# Patient Record
Sex: Female | Born: 1990 | Race: White | Hispanic: No | Marital: Married | Smoking: Never smoker
Health system: Southern US, Community
[De-identification: ages and names within clinical notes are randomized; demographics above are authoritative.]

## PROBLEM LIST (undated history)

## (undated) DIAGNOSIS — J309 Allergic rhinitis, unspecified: Secondary | ICD-10-CM

## (undated) DIAGNOSIS — A6000 Herpesviral infection of urogenital system, unspecified: Secondary | ICD-10-CM

## (undated) DIAGNOSIS — Z23 Encounter for immunization: Secondary | ICD-10-CM

## (undated) DIAGNOSIS — J45909 Unspecified asthma, uncomplicated: Secondary | ICD-10-CM

## (undated) HISTORY — DX: Unspecified asthma, uncomplicated: J45.909

## (undated) HISTORY — DX: Encounter for immunization: Z23

## (undated) HISTORY — DX: Herpesviral infection of urogenital system, unspecified: A60.00

## (undated) HISTORY — PX: WISDOM TOOTH EXTRACTION: SHX21

## (undated) HISTORY — DX: Allergic rhinitis, unspecified: J30.9

---
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
## 2014-12-15 DIAGNOSIS — A6000 Herpesviral infection of urogenital system, unspecified: Secondary | ICD-10-CM | POA: Insufficient documentation

## 2014-12-15 DIAGNOSIS — B86 Scabies: Secondary | ICD-10-CM | POA: Insufficient documentation

## 2014-12-15 DIAGNOSIS — Z7251 High risk heterosexual behavior: Secondary | ICD-10-CM | POA: Insufficient documentation

## 2014-12-15 DIAGNOSIS — F430 Acute stress reaction: Secondary | ICD-10-CM | POA: Insufficient documentation

## 2014-12-15 DIAGNOSIS — F419 Anxiety disorder, unspecified: Secondary | ICD-10-CM | POA: Insufficient documentation

## 2014-12-15 DIAGNOSIS — N390 Urinary tract infection, site not specified: Secondary | ICD-10-CM | POA: Insufficient documentation

## 2014-12-19 ENCOUNTER — Encounter: Payer: Self-pay | Admitting: Family Medicine

## 2014-12-19 ENCOUNTER — Ambulatory Visit (INDEPENDENT_AMBULATORY_CARE_PROVIDER_SITE_OTHER): Payer: BLUE CROSS/BLUE SHIELD | Admitting: Family Medicine

## 2014-12-19 ENCOUNTER — Ambulatory Visit: Payer: Self-pay | Admitting: Family Medicine

## 2014-12-19 VITALS — BP 104/76 | HR 76 | Temp 98.5°F | Resp 16 | Wt 129.2 lb

## 2014-12-19 DIAGNOSIS — A09 Infectious gastroenteritis and colitis, unspecified: Secondary | ICD-10-CM | POA: Diagnosis not present

## 2014-12-19 DIAGNOSIS — R197 Diarrhea, unspecified: Secondary | ICD-10-CM

## 2014-12-19 DIAGNOSIS — A6000 Herpesviral infection of urogenital system, unspecified: Secondary | ICD-10-CM | POA: Diagnosis not present

## 2014-12-19 MED ORDER — VALACYCLOVIR HCL 500 MG PO TABS: 500.0000 mg | ORAL_TABLET | Freq: Every day | ORAL | Status: DC

## 2014-12-19 NOTE — Patient Instructions (Signed)
Chronic Diarrhea Diarrhea is frequent loose and watery bowel movements. It can cause you to feel weak and dehydrated. Dehydration can cause you to become tired and thirsty and to have a dry mouth, decreased urination, and dark yellow urine. Diarrhea is a sign of another problem, most often an infection that will not last long. In most cases, diarrhea lasts 2-3 days. Diarrhea that lasts longer than 4 weeks is called long-lasting (chronic) diarrhea. It is important to treat your diarrhea as directed by your health care provider to lessen or prevent future episodes of diarrhea.  CAUSES  There are many causes of chronic diarrhea. The following are some possible causes:   Gastrointestinal infections caused by viruses, bacteria, or parasites.   Food poisoning or food allergies.   Certain medicines, such as antibiotics, chemotherapy, and laxatives.   Artificial sweeteners and fructose.   Digestive disorders, such as celiac disease and inflammatory bowel diseases.   Irritable bowel syndrome.  Some disorders of the pancreas.  Disorders of the thyroid.  Reduced blood flow to the intestines.  Cancer. Sometimes the cause of chronic diarrhea is unknown. RISK FACTORS  Having a severely weakened immune system, such as from HIV or AIDS.   Taking certain types of cancer-fighting drugs (such as with chemotherapy) or other medicines.   Having had a recent organ transplant.   Having a portion of the stomach or small bowel removed.   Traveling to countries where food and water supplies are often contaminated.  SYMPTOMS  In addition to frequent, loose stools, diarrhea may cause:   Cramping.   Abdominal pain.   Nausea.   Fever.  Fatigue.  Urgent need to use the bathroom.  Loss of bowel control. DIAGNOSIS  Your health care provider must take a careful history and perform a physical exam. Tests given are based on your symptoms and history. Tests may include:   Blood or  stool tests. Three or more stool samples may be examined. Stool cultures may be used to test for bacteria or parasites.   X-rays.   A procedure in which a thin tube is inserted into the mouth or rectum (endoscopy). This allows the health care provider to look inside the intestine.  TREATMENT   Treatment is aimed at correcting the cause of the diarrhea when possible.  Diarrhea caused by an infection can often be treated with antibiotic medicines.  Diarrhea not caused by an infection may require you to take long-term medicine or have surgery. Specific treatment should be discussed with your health care provider.  If the cause cannot be determined, treatment aims to relieve symptoms and prevent dehydration. Serious health problems can occur if you do not maintain proper fluid levels. Treatment may include:  Taking an oral rehydration solution (ORS).  Not drinking beverages that contain caffeine (such as tea, coffee, and soft drinks).  Not drinking alcohol.  Maintaining well-balanced nutrition to help you recover faster. HOME CARE INSTRUCTIONS   Drink enough fluids to keep urine clear or pale yellow. Drink 1 cup (8 oz) of fluid for each diarrhea episode. Avoid fluids that contain simple sugars, fruit juices, whole milk products, and sodas. Hydrate with an ORS. You may purchase the ORS or prepare it at home by mixing the following ingredients together:   - tsp (1.7-3  mL) table salt.   tsp (3  mL) baking soda.   tsp (1.7 mL) salt substitute containing potassium chloride.  1 tbsp (20 mL) sugar.  4.2 c (1 L) of water.     Certain foods and beverages may increase the speed at which food moves through the gastrointestinal (GI) tract. These foods and beverages should be avoided. They include:  Caffeinated and alcoholic beverages.  High-fiber foods, such as raw fruits and vegetables, nuts, seeds, and whole grain breads and cereals.  Foods and beverages sweetened with sugar  alcohols, such as xylitol, sorbitol, and mannitol.   Some foods may be well tolerated and may help thicken stool. These include:  Starchy foods, such as rice, toast, pasta, low-sugar cereal, oatmeal, grits, baked potatoes, crackers, and bagels.  Bananas.  Applesauce.  Add probiotic-rich foods to help increase healthy bacteria in the GI tract. These include yogurt and fermented milk products.  Wash your hands well after each diarrhea episode.  Only take over-the-counter or prescription medicines as directed by your health care provider.  Take a warm bath to relieve any burning or pain from frequent diarrhea episodes. SEEK MEDICAL CARE IF:   You are not urinating as often.  Your urine is a dark color.  You become very tired or dizzy.  You have severe pain in the abdomen or rectum.  Your have blood or pus in your stools.  Your stools look black and tarry. SEEK IMMEDIATE MEDICAL CARE IF:   You are unable to keep fluids down.  You have persistent vomiting.  You have blood in your stool.  Your stools are black and tarry.  You do not urinate in 6-8 hours, or there is only a small amount of very dark urine.  You have abdominal pain that increases or localizes.  You have weakness, dizziness, confusion, or lightheadedness.  You have a severe headache.  Your diarrhea gets worse or does not get better.  You have a fever or persistent symptoms for more than 2-3 days.  You have a fever and your symptoms suddenly get worse. MAKE SURE YOU:   Understand these instructions.  Will watch your condition.  Will get help right away if you are not doing well or get worse.   This information is not intended to replace advice given to you by your health care provider. Make sure you discuss any questions you have with your health care provider.   Document Released: 04/13/2003 Document Revised: 01/26/2013 Document Reviewed: 07/16/2012 Elsevier Interactive Patient Education 2016  Elsevier Inc[employer]  

## 2014-12-19 NOTE — Progress Notes (Signed)
Patient ID: Valerie Avila, female   DOB: [birth year], 24 y.o.   MRN: [id number] Name: Valerie Avila   MRN: [id number]    DOB: 1990-10-04   Date:12/19/2014       Progress Note  Subjective  Chief Complaint  Chief Complaint  Patient presents with  . Abdominal Pain    Abdominal Pain This is a new problem. The current episode started more than 1 month ago. The problem occurs intermittently. The problem has been unchanged. Associated symptoms include diarrhea and nausea.  Initial bout of diarrhea and cramps with some vomiting occurred approximately 1 month ago when her community had E.coli in the city [place]. The second week she did not have any bowel movements and did not take any anti-diarrheal medications. Restarted diarrhea and cramps after meals over the past couple weeks. No further vomiting and no hematochezia. No weight loss or dizziness. Eating 3 meals a day. Worried about having persistent infection in gut.  Past Surgical History  Procedure Laterality Date  . Wisdom tooth extraction     Family History  Problem Relation Age of Onset  . Healthy Mother   . Hyperlipidemia Father   . Hypertension Father   . Healthy Sister   . Heart failure Maternal Grandmother   . Pancreatic cancer Maternal Grandfather   . Heart disease Paternal Grandfather    Patient Active Problem List   Diagnosis Date Noted  . Acute stress disorder 12/15/2014  . Anxiety 12/15/2014  . Genital herpes 12/15/2014  . High risk sexual behavior 12/15/2014  . Frequent UTI 12/15/2014  . 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 Asthma, exogenous 02/05/2003   Social History  Substance Use Topics  . Smoking status: Never Smoker   . Smokeless tobacco: Never Used  . Alcohol Use: 0.0 oz/week    0 Standard drinks or equivalent per week     Comment: OCCASIONALLY DRINKS BEER    Current outpatient prescriptions:  .  albuterol (PROVENTIL HFA;VENTOLIN HFA) 108 (90 BASE) MCG/ACT inhaler,  ALBUTEROL, 90MCG/ACT (Inhalation Aerosol Solution)  2 puffs every 4-6 hours as needed for 0 days  Quantity: 0.00;  Refills: 0   Ordered :17-Oct-2009  Darlin Priestly ;  Started 29-Jun-2008 Active Comments: Medication taken as needed. , Disp: , Rfl:  .  ascorbic acid (VITAMIN C) 500 MG tablet, Take by mouth., Disp: , Rfl:  .  desogestrel-ethinyl estradiol (APRI) 0.15-30 MG-MCG tablet, Take by mouth., Disp: , Rfl:  .  fexofenadine (ALLEGRA ALLERGY) 180 MG tablet, Take by mouth., Disp: , Rfl:  .  fluticasone (FLONASE) 50 MCG/ACT nasal spray, Place into the nose., Disp: , Rfl:  .  montelukast (SINGULAIR) 10 MG tablet, Take by mouth., Disp: , Rfl:  .  valACYclovir (VALTREX) 500 MG tablet, Take by mouth., Disp: , Rfl:   Allergies  Allergen Reactions  . Amoxicillin Rash   Review of Systems  Constitutional: Negative.   HENT: Negative.   Eyes: Negative.   Respiratory: Negative.   Cardiovascular: Negative.   Gastrointestinal: Positive for nausea, abdominal pain and diarrhea.  Genitourinary: Negative.   Musculoskeletal: Negative.   Skin: Negative.   Neurological: Negative.   Endo/Heme/Allergies: Negative.   Psychiatric/Behavioral: Negative.    Objective  Filed Vitals:   12/19/14 1623  BP: 104/76  Pulse: 76  Temp: 98.5 F (36.9 C)  TempSrc: Oral  Resp: 16  Weight: 129 lb 3.2 oz (58.605 kg)  SpO2: 95%   Physical Exam  Constitutional: She is oriented to person,  place, and time and well-developed, well-nourished, and in no distress.  HENT:  Head: Normocephalic and atraumatic.  Right Ear: External ear normal.  Left Ear: External ear normal.  Eyes: Conjunctivae and EOM are normal.  Neck: Normal range of motion. Neck supple.  Cardiovascular: Normal rate, regular rhythm and normal heart sounds.   Pulmonary/Chest: Effort normal and breath sounds normal.  Abdominal: Soft. Bowel sounds are normal. She exhibits no mass. There is no guarding.  Musculoskeletal: Normal range of motion.   Neurological: She is alert and oriented to person, place, and time.  Skin: No rash noted.  Psychiatric: Memory, affect and judgment normal.   Assessment & Plan 1. Diarrhea of presumed infectious origin Persistent loose to watery stools without fever over the past 2-3 weeks. Denies travel outside of the [place] or to the flood affected areas of [place]. With history of her community having E.coli contamination to their city water, will get stool culture and check for O&P. May use Pepto-Bismol prn cramps. May need probiotic and antibiotic treatment pending lab reports. Continue to drink extra fluids and limit dairy products. Denies dizziness or fainting sensations. No weight loss since May 2016 (actually 4 lbs heavier). - Stool Culture - Ova and parasite examination  2. Genital herpes No outbreak in the past year as long as she takes the Valtrex once a day to prevent recurrences. Will refill and follow up prn. - valACYclovir (VALTREX) 500 MG tablet; Take 1 tablet (500 mg total) by mouth daily.  Dispense: 30 tablet; Refill: 3

## 2015-07-11 ENCOUNTER — Ambulatory Visit (INDEPENDENT_AMBULATORY_CARE_PROVIDER_SITE_OTHER): Payer: BLUE CROSS/BLUE SHIELD | Admitting: Family Medicine

## 2015-07-11 ENCOUNTER — Encounter: Payer: Self-pay | Admitting: Family Medicine

## 2015-07-11 VITALS — BP 104/60 | HR 72 | Temp 97.8°F | Resp 16 | Wt 128.0 lb

## 2015-07-11 DIAGNOSIS — N3001 Acute cystitis with hematuria: Secondary | ICD-10-CM

## 2015-07-11 DIAGNOSIS — R109 Unspecified abdominal pain: Secondary | ICD-10-CM

## 2015-07-11 LAB — POCT URINALYSIS DIPSTICK
BILIRUBIN UA: NEGATIVE
Glucose, UA: NEGATIVE
KETONES UA: NEGATIVE
Nitrite, UA: NEGATIVE
PH UA: 6.5
Protein, UA: NEGATIVE
Spec Grav, UA: >=1.03
Urobilinogen, UA: 0.2

## 2015-07-11 MED ORDER — NITROFURANTOIN MONOHYD MACRO 100 MG PO CAPS: 100.0000 mg | ORAL_CAPSULE | Freq: Two times a day (BID) | ORAL | Status: DC

## 2015-07-11 NOTE — Progress Notes (Signed)
Patient ID: Valerie Avila, female   DOB: 1990-09-23, 23 y.o.   MRN: [id number]       Patient: Valerie Avila Female    DOB: [birth year]   25 y.o.   MRN: [id number] Visit Date: 07/11/2015  Today's Provider: Vernie Murders, PA   Chief Complaint  Patient presents with  . Urinary Tract Infection    X 4 days.    Subjective:    Urinary Tract Infection  This is a new problem. The current episode started in the past 7 days. The problem has been gradually worsening. There has been no fever. Associated symptoms include flank pain and frequency. Pertinent negatives include no urgency.  Patient reports that she has had symptoms for the last 4 days. She denies any hematuria, but she does c/o low back pain. Patient reports that she has been taking AZO with no relief.   No past medical history on file. Patient Active Problem List   Diagnosis Date Noted  . Acute stress disorder 12/15/2014  . Anxiety 12/15/2014  . Genital herpes 12/15/2014  . High risk sexual behavior 12/15/2014  . Frequent UTI 12/15/2014  . 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 Asthma, exogenous 02/05/2003   Past Surgical History  Procedure Laterality Date  . Wisdom tooth extraction     Family History  Problem Relation Age of Onset  . Healthy Mother   . Hyperlipidemia Father   . Hypertension Father   . Healthy Sister   . Heart failure Maternal Grandmother   . Pancreatic cancer Maternal Grandfather   . Heart disease Paternal Grandfather    Allergies  Allergen Reactions  . Amoxicillin Rash   Previous Medications   ALBUTEROL (PROVENTIL HFA;VENTOLIN HFA) 108 (90 BASE) MCG/ACT INHALER    ALBUTEROL, 90MCG/ACT (Inhalation Aerosol Solution)  2 puffs every 4-6 hours as needed for 0 days  Quantity: 0.00;  Refills: 0   Ordered :17-Oct-2009  Darlin Priestly ;  Started 29-Jun-2008 Active Comments: Medication taken as needed.    ASCORBIC ACID (VITAMIN C) 500 MG TABLET    Take by mouth.   DESOGESTREL-ETHINYL  ESTRADIOL (APRI) 0.15-30 MG-MCG TABLET    Take by mouth.   FEXOFENADINE (ALLEGRA ALLERGY) 180 MG TABLET    Take by mouth.   FLUTICASONE (FLONASE) 50 MCG/ACT NASAL SPRAY    Place into the nose.   MONTELUKAST (SINGULAIR) 10 MG TABLET    Take by mouth.   VALACYCLOVIR (VALTREX) 500 MG TABLET    Take 1 tablet (500 mg total) by mouth daily.    Review of Systems  Constitutional: Negative.   Genitourinary: Positive for frequency and flank pain. Negative for urgency, decreased urine volume, vaginal bleeding, vaginal discharge, difficulty urinating, vaginal pain and pelvic pain.    Social History  Substance Use Topics  . Smoking status: Never Smoker   . Smokeless tobacco: Never Used  . Alcohol Use: 0.0 oz/week    0 Standard drinks or equivalent per week     Comment: OCCASIONALLY DRINKS BEER   Objective:   BP 104/60 mmHg  Pulse 72  Temp(Src) 97.8 F (36.6 C)  Resp 16  Wt 128 lb (58.06 kg) LMP 3 weeks ago (will finish Apri 28 day pack next week).  Physical Exam  Constitutional: She is oriented to person, place, and time. She appears well-developed and well-nourished. No distress.  HENT:  Head: Normocephalic and atraumatic.  Right Ear: Hearing normal.  Left Ear: Hearing normal.  Nose: Nose normal.  Eyes: Conjunctivae and lids are normal.  Right eye exhibits no discharge. Left eye exhibits no discharge. No scleral icterus.  Neck: Neck supple.  Cardiovascular: Normal rate and regular rhythm.   Pulmonary/Chest: Effort normal and breath sounds normal. No respiratory distress.  Abdominal: Bowel sounds are normal. There is tenderness.  Tender suprapubic region over bladder. Left CVA tenderness to percussion.   Musculoskeletal: Normal range of motion.  Neurological: She is alert and oriented to person, place, and time.  Skin: Skin is intact. No lesion and no rash noted.  Psychiatric: She has a normal mood and affect. Her speech is normal and behavior is normal. Thought content normal.        Assessment & Plan:     1. Acute cystitis with hematuria Onset with dysuria and left flank pain 3 days ago. Tried AZO-Standard and slightly better. Family history positive for renal stones. Patient has had UTI's twice a year. No gross hematuria (noted on dipstick today). Urinalysis showed TNTC WBC's and 2-3+ bacterial rods. Will start antibiotic, continue AZO, increase water intake and get renal ultrasound to rule out stone. Sent specimen for C&S. Recheck pending reports. - Urine culture - POCT urinalysis dipstick - nitrofurantoin, macrocrystal-monohydrate, (MACROBID) 100 MG capsule; Take 1 capsule (100 mg total) by mouth 2 (two) times daily.  Dispense: 20 capsule; Refill: 0  2. Left flank pain Onset Saturday 07-08-15. No gross hematuria. Will get renal ultrasound to rule out stone. - US Renal       Vernie Murders, [facility]

## 2015-07-13 LAB — URINE CULTURE

## 2015-07-17 ENCOUNTER — Ambulatory Visit: Payer: Self-pay | Attending: Family Medicine

## 2015-07-18 DIAGNOSIS — L309 Dermatitis, unspecified: Secondary | ICD-10-CM | POA: Diagnosis not present

## 2015-11-16 DIAGNOSIS — D225 Melanocytic nevi of trunk: Secondary | ICD-10-CM | POA: Diagnosis not present

## 2015-12-14 ENCOUNTER — Ambulatory Visit (INDEPENDENT_AMBULATORY_CARE_PROVIDER_SITE_OTHER): Payer: BLUE CROSS/BLUE SHIELD | Admitting: Family Medicine

## 2015-12-14 ENCOUNTER — Ambulatory Visit
Admission: RE | Admit: 2015-12-14 | Discharge: 2015-12-14 | Disposition: A | Discharge status: Home / Self Care | Payer: BLUE CROSS/BLUE SHIELD | Source: Ambulatory Visit | Attending: Family Medicine | Admitting: Family Medicine

## 2015-12-14 ENCOUNTER — Encounter: Payer: Self-pay | Admitting: Family Medicine

## 2015-12-14 VITALS — BP 102/68 | HR 80 | Temp 97.8°F | Resp 16 | Wt 129.0 lb

## 2015-12-14 DIAGNOSIS — Z23 Encounter for immunization: Secondary | ICD-10-CM | POA: Diagnosis not present

## 2015-12-14 DIAGNOSIS — M79644 Pain in right finger(s): Secondary | ICD-10-CM | POA: Insufficient documentation

## 2015-12-14 DIAGNOSIS — M79641 Pain in right hand: Secondary | ICD-10-CM | POA: Diagnosis not present

## 2015-12-14 DIAGNOSIS — M7989 Other specified soft tissue disorders: Secondary | ICD-10-CM | POA: Diagnosis not present

## 2015-12-14 NOTE — Progress Notes (Signed)
Patient: Valerie Avila Female    DOB: Nov 14, 1990   25 y.o.   MRN: [id number] Visit Date: 12/14/2015  Today's Provider: Vernie Murders, PA   Chief Complaint  Patient presents with  . Hand Pain   Subjective:    Hand Pain   The incident occurred 3 to 5 days ago. Incident location: playing ball. Pain location: right middle finger. Associated symptoms comments: Pain, swelling, bruising . The symptoms are aggravated by movement. She has tried nothing for the symptoms.    Patient Active Problem List   Diagnosis Date Noted  . Acute stress disorder 12/15/2014  . Anxiety 12/15/2014  . Genital herpes 12/15/2014  . High risk sexual behavior 12/15/2014  . Frequent UTI 12/15/2014  . 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 Asthma, exogenous 02/05/2003   Past Surgical History:  Procedure Laterality Date  . WISDOM TOOTH EXTRACTION     Family History  Problem Relation Age of Onset  . Healthy Mother   . Hyperlipidemia Father   . Hypertension Father   . Healthy Sister   . Heart failure Maternal Grandmother   . Pancreatic cancer Maternal Grandfather   . Heart disease Paternal Grandfather    Allergies  Allergen Reactions  . Amoxicillin Rash  . Prednisone Rash    Rash on face, swelling, sick      Previous Medications   ALBUTEROL (PROVENTIL HFA;VENTOLIN HFA) 108 (90 BASE) MCG/ACT INHALER    ALBUTEROL, 90MCG/ACT (Inhalation Aerosol Solution)  2 puffs every 4-6 hours as needed for 0 days  Quantity: 0.00;  Refills: 0   Ordered :17-Oct-2009  Darlin Priestly ;  Started 29-Jun-2008 Active Comments: Medication taken as needed.    ASCORBIC ACID (VITAMIN C) 500 MG TABLET    Take by mouth.   DESOGESTREL-ETHINYL ESTRADIOL (APRI) 0.15-30 MG-MCG TABLET    Take by mouth.   FEXOFENADINE (ALLEGRA ALLERGY) 180 MG TABLET    Take by mouth.   FLUTICASONE (FLONASE) 50 MCG/ACT NASAL SPRAY    Place into the nose.   MONTELUKAST (SINGULAIR) 10 MG TABLET    Take by mouth.   VALACYCLOVIR (VALTREX)  500 MG TABLET    Take 1 tablet (500 mg total) by mouth daily.    Review of Systems  Constitutional: Negative.   Respiratory: Negative.   Cardiovascular: Negative.   Musculoskeletal: Positive for arthralgias and joint swelling.    Social History  Substance Use Topics  . Smoking status: Never Smoker  . Smokeless tobacco: Never Used  . Alcohol use 0.0 oz/week     Comment: OCCASIONALLY DRINKS BEER   Objective:   BP 102/68 (BP Location: Right Arm, Patient Position: Sitting, Cuff Size: Normal)   Pulse 80   Temp 97.8 F (36.6 C) (Oral)   Resp 16   Wt 129 lb (58.5 kg)   Physical Exam  Constitutional: She is oriented to person, place, and time. She appears well-developed and well-nourished. No distress.  HENT:  Head: Normocephalic and atraumatic.  Right Ear: Hearing normal.  Left Ear: Hearing normal.  Nose: Nose normal.  Eyes: Conjunctivae and lids are normal. Right eye exhibits no discharge. Left eye exhibits no discharge. No scleral icterus.  Pulmonary/Chest: Effort normal. No respiratory distress.  Musculoskeletal: She exhibits tenderness.  Some swelling and slight ecchymosis of the right middle finger from MCP to PIP joint. Unable to flex or apply lateral pressure without increase in pain. No neurologic deficit.  Neurological: She is alert and oriented to person, place, and time.  Skin: Skin is  intact. No lesion and no rash noted.  Psychiatric: She has a normal mood and affect. Her speech is normal and behavior is normal. Thought content normal.      Assessment & Plan:     1. Pain of right middle finger Onset over the past 3-4 days. Can't remember a specific injury but started after bowling. Suspect severe sprain. Will get x-ray to rule out fracture. "Buddy-tape" to an adjacent finger for support. Take Ibuprofen 200 mg 3-4 tablets TID with food for discomfort. Recheck pending x-ray report. - DG Hand Complete Right  2. Need for influenza vaccination - Flu Vaccine QUAD 36+  mos PF IM (Fluarix & Fluzone Quad PF)

## 2015-12-15 ENCOUNTER — Telehealth: Payer: Self-pay

## 2015-12-15 NOTE — Telephone Encounter (Signed)
-----   Message from Margo Common, [place] sent at 12/14/2015  2:28 PM EST ----- Normal x-ray of hand and fingers. No bony abnormalities. Proceed with use of taping finger to the adjacent finger for support. Use Ibuprofen 200 mg 3-4 tablets TID with meals and recheck in 10-14 days if no better.

## 2015-12-15 NOTE — Telephone Encounter (Signed)
Patient has been advised. KW 

## 2016-01-05 ENCOUNTER — Encounter: Payer: Self-pay | Admitting: Family Medicine

## 2016-01-05 ENCOUNTER — Ambulatory Visit (INDEPENDENT_AMBULATORY_CARE_PROVIDER_SITE_OTHER): Payer: BLUE CROSS/BLUE SHIELD | Admitting: Family Medicine

## 2016-01-05 VITALS — BP 100/60 | HR 89 | Temp 98.3°F | Resp 16 | Wt 131.8 lb

## 2016-01-05 DIAGNOSIS — J0140 Acute pansinusitis, unspecified: Secondary | ICD-10-CM | POA: Diagnosis not present

## 2016-01-05 DIAGNOSIS — J4521 Mild intermittent asthma with (acute) exacerbation: Secondary | ICD-10-CM | POA: Diagnosis not present

## 2016-01-05 MED ORDER — ALBUTEROL SULFATE HFA 108 (90 BASE) MCG/ACT IN AERS: INHALATION_SPRAY | RESPIRATORY_TRACT | 1 refills | Status: DC

## 2016-01-05 MED ORDER — AZITHROMYCIN 250 MG PO TABS: ORAL_TABLET | ORAL | 0 refills | Status: DC

## 2016-01-05 NOTE — Progress Notes (Signed)
Patient: Valerie Avila Female    DOB: Jun 11, 1990   25 y.o.   MRN: [id number] Visit Date: 01/05/2016  Today's Provider: Vernie Murders, PA   Chief Complaint  Patient presents with  . Sinusitis   Subjective:    Sinusitis  This is a new problem. Episode onset: 9 days ago. The problem is unchanged. Maximum temperature: Some sweats and subjective fever. Associated symptoms include congestion, coughing, ear pain, headaches, shortness of breath and sinus pressure. Past treatments include oral decongestants (inhaler use). The treatment provided mild relief.   Patient Active Problem List   Diagnosis Date Noted  . Acute stress disorder 12/15/2014  . Anxiety 12/15/2014  . Genital herpes 12/15/2014  . High risk sexual behavior 12/15/2014  . Frequent UTI 12/15/2014  . 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 Asthma, exogenous 02/05/2003   Past Surgical History:  Procedure Laterality Date  . WISDOM TOOTH EXTRACTION     Family History  Problem Relation Age of Onset  . Healthy Mother   . Hyperlipidemia Father   . Hypertension Father   . Healthy Sister   . Heart failure Maternal Grandmother   . Pancreatic cancer Maternal Grandfather   . Heart disease Paternal Grandfather    Allergies  Allergen Reactions  . Amoxicillin Rash  . Prednisone Rash    Rash on face, swelling, sick      Previous Medications   ALBUTEROL (PROVENTIL HFA;VENTOLIN HFA) 108 (90 BASE) MCG/ACT INHALER    ALBUTEROL, 90MCG/ACT (Inhalation Aerosol Solution)  2 puffs every 4-6 hours as needed for 0 days  Quantity: 0.00;  Refills: 0   Ordered :17-Oct-2009  Darlin Priestly ;  Started 29-Jun-2008 Active Comments: Medication taken as needed.    ASCORBIC ACID (VITAMIN C) 500 MG TABLET    Take by mouth.   DESOGESTREL-ETHINYL ESTRADIOL (APRI) 0.15-30 MG-MCG TABLET    Take by mouth.   FEXOFENADINE (ALLEGRA ALLERGY) 180 MG TABLET    Take by mouth.   FLUTICASONE (FLONASE) 50 MCG/ACT NASAL SPRAY    Place into the nose.    MONTELUKAST (SINGULAIR) 10 MG TABLET    Take by mouth.   VALACYCLOVIR (VALTREX) 500 MG TABLET    Take 1 tablet (500 mg total) by mouth daily.    Review of Systems  Constitutional: Negative.   HENT: Positive for congestion, ear pain and sinus pressure.   Respiratory: Positive for cough and shortness of breath.   Cardiovascular: Negative.   Neurological: Positive for headaches.    Social History  Substance Use Topics  . Smoking status: Never Smoker  . Smokeless tobacco: Never Used  . Alcohol use 0.0 oz/week     Comment: OCCASIONALLY DRINKS BEER   Objective:   BP 100/60 (BP Location: Right Arm, Patient Position: Sitting, Cuff Size: Normal)   Pulse 89   Temp 98.3 F (36.8 C) (Oral)   Resp 16   Wt 131 lb 12.8 oz (59.8 kg)   LMP 12/03/2015 (Approximate)   SpO2 97%   Physical Exam  Constitutional: She is oriented to person, place, and time. She appears well-developed and well-nourished. No distress.  HENT:  Head: Normocephalic and atraumatic.  Right Ear: Hearing and external ear normal.  Left Ear: Hearing and external ear normal.  Nose: Nose normal.  Slightly reddened tonsillar pillars without exudates. Tender maxillary and frontal sinuses to palpate. Fair transillumination throughout. No active bleeding from nasal membranes.  Eyes: Conjunctivae and lids are normal. Right eye exhibits no discharge. Left eye exhibits no discharge. No  scleral icterus.  Neck: Neck supple.  Cardiovascular: Normal rate and regular rhythm.   Pulmonary/Chest: Effort normal and breath sounds normal. No respiratory distress.  Abdominal: Soft. Bowel sounds are normal.  Musculoskeletal: Normal range of motion.  Lymphadenopathy:    She has no cervical adenopathy.  Neurological: She is alert and oriented to person, place, and time.  Skin: Skin is intact. No lesion and no rash noted.  Psychiatric: She has a normal mood and affect. Her speech is normal and behavior is normal. Thought content normal.       Assessment & Plan:     1. Subacute pansinusitis Onset with some sweats, cough, PND, sinus headache and congestion over the past 9 days. No documented fever. Had a nose bleed yesterday. Will continue Mucinex-DM and Tylenol prn. May need to hold Flonase if nose bleeds recur. Treat with Z-pak and follow up as needed. - azithromycin (ZITHROMAX) 250 MG tablet; Take 2 tablets the first day by mouth then one daily for 4 days.  Dispense: 6 tablet; Refill: 0  2. Mild intermittent extrinsic asthma with acute exacerbation Mild wheezing and some cough with yellowish sputum production. Increase fluid intake, given Z-pak and continue Mucinex-DM with Albuterol Inhaler prn. Refill inhaler and recheck prn. - albuterol (PROVENTIL HFA;VENTOLIN HFA) 108 (90 Base) MCG/ACT inhaler; Two inhalations up to four times a day for wheezing/asthma as needed.  Dispense: 18 g; Refill: 1

## 2016-02-01 DIAGNOSIS — Z309 Encounter for contraceptive management, unspecified: Secondary | ICD-10-CM | POA: Diagnosis not present

## 2016-02-01 DIAGNOSIS — Z01419 Encounter for gynecological examination (general) (routine) without abnormal findings: Secondary | ICD-10-CM | POA: Diagnosis not present

## 2016-02-01 DIAGNOSIS — Z113 Encounter for screening for infections with a predominantly sexual mode of transmission: Secondary | ICD-10-CM | POA: Diagnosis not present

## 2016-02-01 DIAGNOSIS — Z124 Encounter for screening for malignant neoplasm of cervix: Secondary | ICD-10-CM | POA: Diagnosis not present

## 2016-02-01 DIAGNOSIS — Z3041 Encounter for surveillance of contraceptive pills: Secondary | ICD-10-CM | POA: Diagnosis not present

## 2016-03-19 ENCOUNTER — Other Ambulatory Visit: Payer: Self-pay | Admitting: Family Medicine

## 2016-03-19 MED ORDER — DESOGESTREL-ETHINYL ESTRADIOL 0.15-30 MG-MCG PO TABS: 1.0000 | ORAL_TABLET | Freq: Every day | ORAL | 3 refills | Status: DC

## 2016-03-19 NOTE — Telephone Encounter (Signed)
Pt needs refill on her birth control pills.  desogestrel-ethinyl estradiol (APRI) 0.15-30 MG-MCG tablet  She now uses [facility]  Pt's call back is  [phone]

## 2016-09-19 ENCOUNTER — Telehealth: Payer: Self-pay

## 2016-09-19 NOTE — Telephone Encounter (Signed)
Pt needs to ask pharmacy for previous generic birth control pills again and see if sx resolve once changing back. If they don't, we can check labs. Could be hormonal.

## 2016-09-19 NOTE — Telephone Encounter (Signed)
Patient is taking different generic of prescribed birth control due to what was available from pharmacy. Pt c/o 8-10 lb weight gain over the past 2 months. She denies changes in diet and has increased physical activity and says she is going the gym more. She doesn't feel the weight gain is normal for her and concerned. Please advise. Thank you!

## 2016-09-19 NOTE — Telephone Encounter (Signed)
Pt called after hours triage c/o that she went to pharmacy to get her birth control and the past couple of moths she has gained a lot of weight and wants to know if it can be changed.   Left msg for pt to call back to discuss birth control and weight gain. Last seen 01/2016.

## 2016-09-19 NOTE — Telephone Encounter (Signed)
Pt aware and plans to discuss with pharmacy.

## 2016-11-06 DIAGNOSIS — D235 Other benign neoplasm of skin of trunk: Secondary | ICD-10-CM | POA: Diagnosis not present

## 2016-11-06 DIAGNOSIS — D485 Neoplasm of uncertain behavior of skin: Secondary | ICD-10-CM | POA: Diagnosis not present

## 2016-11-06 DIAGNOSIS — D225 Melanocytic nevi of trunk: Secondary | ICD-10-CM | POA: Diagnosis not present

## 2016-12-24 DIAGNOSIS — H5213 Myopia, bilateral: Secondary | ICD-10-CM | POA: Diagnosis not present

## 2017-01-29 ENCOUNTER — Ambulatory Visit (INDEPENDENT_AMBULATORY_CARE_PROVIDER_SITE_OTHER): Payer: BLUE CROSS/BLUE SHIELD | Admitting: Family Medicine

## 2017-01-29 ENCOUNTER — Encounter: Payer: Self-pay | Admitting: Family Medicine

## 2017-01-29 VITALS — BP 102/64 | Temp 98.4°F | Resp 16 | Ht 64.0 in | Wt 139.0 lb

## 2017-01-29 DIAGNOSIS — J4521 Mild intermittent asthma with (acute) exacerbation: Secondary | ICD-10-CM

## 2017-01-29 MED ORDER — FLUTICASONE-SALMETEROL 250-50 MCG/DOSE IN AEPB: 1.0000 | INHALATION_SPRAY | Freq: Two times a day (BID) | RESPIRATORY_TRACT | 0 refills | Status: DC

## 2017-01-29 MED ORDER — ALBUTEROL SULFATE HFA 108 (90 BASE) MCG/ACT IN AERS: INHALATION_SPRAY | RESPIRATORY_TRACT | 1 refills | Status: DC

## 2017-01-29 MED ORDER — AZITHROMYCIN 250 MG PO TABS: ORAL_TABLET | ORAL | 0 refills | Status: DC

## 2017-01-29 NOTE — Progress Notes (Signed)
Patient: Valerie Avila Female    DOB: 1990/12/03   26 y.o.   MRN: [id number] Visit Date: 01/29/2017  Today's Provider: Lelon Huh, MD   Chief Complaint  Patient presents with  . URI   Subjective:    URI   This is a new problem. The current episode started in the past 7 days (3 days). There has been no fever (has felt feverish). Associated symptoms include congestion, coughing, headaches, rhinorrhea and sinus pain. Pertinent negatives include no abdominal pain, chest pain, nausea or vomiting. She has tried decongestant and acetaminophen for the symptoms. The treatment provided no relief.   Has had asthma since childhood, now having to use inhaler every day, which is effective. Is taking Singulair every day, but no maintenance inhalers. . She does not smoke. She has mild sore throat. Scheduled for CPE next week at [facility] and anticipates getting flu vaccine then.   Allergies  Allergen Reactions  . Amoxicillin Rash  . Prednisone Rash    Rash on face, swelling, sick      Current Outpatient Medications:  .  albuterol (PROVENTIL HFA;VENTOLIN HFA) 108 (90 Base) MCG/ACT inhaler, Two inhalations up to four times a day for wheezing/asthma as needed., Disp: 18 g, Rfl: 1 .  desogestrel-ethinyl estradiol (APRI) 0.15-30 MG-MCG tablet, Take 1 tablet by mouth daily., Disp: 1 Package, Rfl: 3 .  fexofenadine (ALLEGRA ALLERGY) 180 MG tablet, Take by mouth., Disp: , Rfl:  .  fluticasone (FLONASE) 50 MCG/ACT nasal spray, Place into the nose., Disp: , Rfl:  .  montelukast (SINGULAIR) 10 MG tablet, Take by mouth., Disp: , Rfl:  .  valACYclovir (VALTREX) 500 MG tablet, Take 1 tablet (500 mg total) by mouth daily., Disp: 30 tablet, Rfl: 3 .  ascorbic acid (VITAMIN C) 500 MG tablet, Take by mouth., Disp: , Rfl:  .  azithromycin (ZITHROMAX) 250 MG tablet, Take 2 tablets the first day by mouth then one daily for 4 days. (Patient not taking: Reported on 01/29/2017), Disp: 6 tablet, Rfl: 0  Review of  Systems  Constitutional: Negative for appetite change, chills, fatigue and fever.  HENT: Positive for congestion, postnasal drip, rhinorrhea and sinus pain.   Respiratory: Positive for cough. Negative for chest tightness and shortness of breath.   Cardiovascular: Negative for chest pain and palpitations.  Gastrointestinal: Negative for abdominal pain, nausea and vomiting.  Neurological: Positive for headaches. Negative for dizziness and weakness.    Social History   Tobacco Use  . Smoking status: Never Smoker  . Smokeless tobacco: Never Used  Substance Use Topics  . Alcohol use: Yes    Alcohol/week: 0.0 oz    Comment: OCCASIONALLY DRINKS BEER   Objective:   BP 102/64 (BP Location: Left Arm, Patient Position: Sitting, Cuff Size: Normal)   Temp 98.4 F (36.9 C)   Resp 16   Ht 5\' 4"  (1.626 m)   Wt 139 lb (63 kg)   BMI 23.86 kg/m  Vitals:   01/29/17 1644  BP: 102/64  Resp: 16  Temp: 98.4 F (36.9 C)  Weight: 139 lb (63 kg)  Height: 5\' 4"  (1.626 m)     Physical Exam  General Appearance:    Alert, cooperative, no distress  HENT:   bilateral TM normal without fluid or infection, neck without nodes, pharynx erythematous without exudate, sinuses nontender and nasal mucosa congested  Eyes:    PERRL, conjunctiva/corneas clear, EOM's intact       Lungs:  Occasional faint wheeze, respirations unlabored  Heart:    Regular rate and rhythm  Neurologic:   Awake, alert, oriented x 3. No apparent focal neurological           defect.           Assessment & Plan:     1. Mild intermittent extrinsic asthma with acute exacerbation She has had adverse reactions to prednisone in the past, but has taken Advair without adverse reaction.  - azithromycin (ZITHROMAX) 250 MG tablet; 2 by mouth today, then 1 daily for 4 days  Dispense: 6 tablet; Refill: 0 - Fluticasone-Salmeterol (ADVAIR DISKUS) 250-50 MCG/DOSE AEPB; Inhale 1 puff into the lungs 2 times daily at 12 noon and 4 pm for 7  days.  Dispense: 14 each; Refill: 0 - albuterol (PROVENTIL HFA;VENTOLIN HFA) 108 (90 Base) MCG/ACT inhaler; Two inhalations up to four times a day for wheezing/asthma as needed.  Dispense: 18 g; Refill: 1  Call if symptoms change or if not rapidly improving.          Lelon Huh, MD  [facility]

## 2017-02-03 ENCOUNTER — Ambulatory Visit (INDEPENDENT_AMBULATORY_CARE_PROVIDER_SITE_OTHER): Payer: BLUE CROSS/BLUE SHIELD | Admitting: Obstetrics and Gynecology

## 2017-02-03 ENCOUNTER — Encounter: Payer: Self-pay | Admitting: Obstetrics and Gynecology

## 2017-02-03 VITALS — BP 110/74 | HR 84 | Ht 64.0 in | Wt 137.0 lb

## 2017-02-03 DIAGNOSIS — Z01419 Encounter for gynecological examination (general) (routine) without abnormal findings: Secondary | ICD-10-CM | POA: Diagnosis not present

## 2017-02-03 DIAGNOSIS — Z113 Encounter for screening for infections with a predominantly sexual mode of transmission: Secondary | ICD-10-CM

## 2017-02-03 DIAGNOSIS — Z124 Encounter for screening for malignant neoplasm of cervix: Secondary | ICD-10-CM

## 2017-02-03 DIAGNOSIS — Z3041 Encounter for surveillance of contraceptive pills: Secondary | ICD-10-CM | POA: Diagnosis not present

## 2017-02-03 MED ORDER — DESOGESTREL-ETHINYL ESTRADIOL 0.15-30 MG-MCG PO TABS: 1.0000 | ORAL_TABLET | Freq: Every day | ORAL | 3 refills | Status: DC

## 2017-02-03 NOTE — Patient Instructions (Signed)
I value your feedback and entrusting us with your care. If you get a [facility] patient survey, I would appreciate you taking the time to let us know about your experience today. Thank you! 

## 2017-02-03 NOTE — Progress Notes (Signed)
PCP:  Margo Common, PA   Chief Complaint  Patient presents with  . Gynecologic Exam    wants to make sure everything is alright ie able to concieve     HPI:      [name] is a 26 y.o. No obstetric history on file. who LMP was Patient's last menstrual period was 01/19/2017 (exact date)., presents today for her annual examination.  Her menses are regular every 28-30 days, lasting 4 days.  Dysmenorrhea mild, occurring first 1-2 days of flow. She does not usually have intermenstrual bleeding but had 1 day of it this month, without late/missed OCPs.  Sex activity: single partner, contraception - OCP (estrogen/progesterone).  Last Pap: February 01, 2016  Results were: no abnormalities Hx of STDs: HSV, takes valtrex prn, done through PCP  There is no FH of breast cancer. There is no FH of ovarian cancer. The patient does do self-breast exams.  Tobacco use: The patient denies current or previous tobacco use. Alcohol use: none No drug use.  Exercise: moderately active  She does get adequate calcium and Vitamin D in her diet.  Gardasil completed.    Past Medical History:  Diagnosis Date  . Allergic rhinitis   . Asthma   . Genital herpes   . Vaccine for human papilloma virus (HPV) types 6, 11, 16, and 18 administered     Past Surgical History:  Procedure Laterality Date  . WISDOM TOOTH EXTRACTION      Family History  Problem Relation Age of Onset  . Healthy Mother   . Hyperlipidemia Father   . Hypertension Father   . Healthy Sister   . Heart failure Maternal Grandmother   . Pancreatic cancer Maternal Grandfather   . Heart disease Paternal Grandfather     Social History   Socioeconomic History  . Marital status: Single    Spouse name: Not on file  . Number of children: Not on file  . Years of education: Not on file  . Highest education level: Not on file  Social Needs  . Financial resource strain: Not on file  . Food insecurity - worry: Not on  file  . Food insecurity - inability: Not on file  . Transportation needs - medical: Not on file  . Transportation needs - non-medical: Not on file  Occupational History  . Not on file  Tobacco Use  . Smoking status: Never Smoker  . Smokeless tobacco: Never Used  Substance and Sexual Activity  . Alcohol use: Yes    Alcohol/week: 0.0 oz    Comment: OCCASIONALLY DRINKS BEER  . Drug use: No  . Sexual activity: Yes    Birth control/protection: Pill  Other Topics Concern  . Not on file  Social History Narrative  . Not on file    Current Meds  Medication Sig  . albuterol (PROVENTIL HFA;VENTOLIN HFA) 108 (90 Base) MCG/ACT inhaler Two inhalations up to four times a day for wheezing/asthma as needed.  Marland Kitchen ascorbic acid (VITAMIN C) 500 MG tablet Take by mouth.  . desogestrel-ethinyl estradiol (APRI) 0.15-30 MG-MCG tablet Take 1 tablet by mouth daily.  . fexofenadine (ALLEGRA ALLERGY) 180 MG tablet Take by mouth.  . fluticasone (FLONASE) 50 MCG/ACT nasal spray Place into the nose.  Marland Kitchen Fluticasone-Salmeterol (ADVAIR DISKUS) 250-50 MCG/DOSE AEPB Inhale 1 puff into the lungs 2 times daily at 12 noon and 4 pm for 7 days.  . montelukast (SINGULAIR) 10 MG tablet Take by mouth.  . valACYclovir (VALTREX)  500 MG tablet Take 1 tablet (500 mg total) by mouth daily.  . [DISCONTINUED] desogestrel-ethinyl estradiol (APRI) 0.15-30 MG-MCG tablet Take 1 tablet by mouth daily.     ROS:  Review of Systems  Constitutional: Positive for fatigue. Negative for fever and unexpected weight change.  Respiratory: Positive for cough and wheezing. Negative for shortness of breath.   Cardiovascular: Negative for chest pain, palpitations and leg swelling.  Gastrointestinal: Negative for blood in stool, constipation, diarrhea, nausea and vomiting.  Endocrine: Negative for cold intolerance, heat intolerance and polyuria.  Genitourinary: Negative for dyspareunia, dysuria, flank pain, frequency, genital sores, hematuria,  menstrual problem, pelvic pain, urgency, vaginal bleeding, vaginal discharge and vaginal pain.  Musculoskeletal: Negative for back pain, joint swelling and myalgias.  Skin: Negative for rash.  Neurological: Negative for dizziness, syncope, light-headedness, numbness and headaches.  Hematological: Negative for adenopathy.  Psychiatric/Behavioral: Negative for agitation, confusion, sleep disturbance and suicidal ideas. The patient is not nervous/anxious.      Objective: BP 110/74   Pulse 84   Ht 5\' 4"  (1.626 m)   Wt 137 lb (62.1 kg)   LMP 01/19/2017 (Exact Date)   BMI 23.52 kg/m    Physical Exam  Constitutional: She is oriented to person, place, and time. She appears well-developed and well-nourished.  Genitourinary: Vagina normal and uterus normal. There is no rash or tenderness on the right labia. There is no rash or tenderness on the left labia. No erythema or tenderness in the vagina. No vaginal discharge found. Right adnexum does not display mass and does not display tenderness. Left adnexum does not display mass and does not display tenderness. Cervix does not exhibit motion tenderness or polyp. Uterus is not enlarged or tender.  Neck: Normal range of motion. No thyromegaly present.  Cardiovascular: Normal rate, regular rhythm and normal heart sounds.  No murmur heard. Pulmonary/Chest: Effort normal and breath sounds normal. Right breast exhibits no mass, no nipple discharge, no skin change and no tenderness. Left breast exhibits no mass, no nipple discharge, no skin change and no tenderness.  Abdominal: Soft. There is no tenderness. There is no guarding.  Musculoskeletal: Normal range of motion.  Neurological: She is alert and oriented to person, place, and time. No cranial nerve deficit.  Psychiatric: She has a normal mood and affect. Her behavior is normal.  Vitals reviewed.   Assessment/Plan: Encounter for annual routine gynecological examination  Cervical cancer screening  - Plan: IGP,CtNgTv,rfx Aptima HPV ASCU  Screening for STD (sexually transmitted disease) - Plan: IGP,CtNgTv,rfx Aptima HPV ASCU  Encounter for surveillance of contraceptive pills - OCP RF. - Plan: desogestrel-ethinyl estradiol (APRI) 0.15-30 MG-MCG tablet  Meds ordered this encounter  Medications  . desogestrel-ethinyl estradiol (APRI) 0.15-30 MG-MCG tablet    Sig: Take 1 tablet by mouth daily.    Dispense:  3 Package    Refill:  3             GYN counsel adequate intake of calcium and vitamin D, diet and exercise     F/U  Return in about 1 year (around 02/03/2018).  Jerin Franzel B. Emonnie Cannady, PA-C 02/03/2017 10:27 AM

## 2017-02-06 LAB — IGP,CTNGTV,RFX APTIMA HPV ASCU
Chlamydia, Nuc. Acid Amp: NEGATIVE
Gonococcus, Nuc. Acid Amp: NEGATIVE
PAP SMEAR COMMENT: 0
Trich vag by NAA: NEGATIVE

## 2017-03-27 ENCOUNTER — Telehealth: Payer: Self-pay | Admitting: Family Medicine

## 2017-03-27 NOTE — Telephone Encounter (Signed)
Pt is scheduled on 04/18/17 4 pm for Td injection. Please advise if this is incorrect or needs to be rescheduled. Thanks TNP

## 2017-03-27 NOTE — Telephone Encounter (Signed)
Td was due on 07/09/2016. Okay to keep appointment.

## 2017-04-18 ENCOUNTER — Ambulatory Visit (INDEPENDENT_AMBULATORY_CARE_PROVIDER_SITE_OTHER): Payer: Managed Care, Other (non HMO) | Admitting: Family Medicine

## 2017-04-18 DIAGNOSIS — Z23 Encounter for immunization: Secondary | ICD-10-CM

## 2017-05-09 ENCOUNTER — Encounter: Payer: Self-pay | Admitting: Family Medicine

## 2017-05-09 ENCOUNTER — Ambulatory Visit: Payer: Managed Care, Other (non HMO) | Admitting: Family Medicine

## 2017-05-09 VITALS — BP 120/80 | HR 100 | Temp 98.5°F | Resp 16 | Wt 138.0 lb

## 2017-05-09 DIAGNOSIS — J4521 Mild intermittent asthma with (acute) exacerbation: Secondary | ICD-10-CM

## 2017-05-09 DIAGNOSIS — J329 Chronic sinusitis, unspecified: Secondary | ICD-10-CM

## 2017-05-09 DIAGNOSIS — J309 Allergic rhinitis, unspecified: Secondary | ICD-10-CM | POA: Insufficient documentation

## 2017-05-09 DIAGNOSIS — J301 Allergic rhinitis due to pollen: Secondary | ICD-10-CM

## 2017-05-09 MED ORDER — AZITHROMYCIN 250 MG PO TABS
ORAL_TABLET | ORAL | 0 refills | Status: AC
Start: 2017-05-09 — End: 2017-05-14

## 2017-05-09 MED ORDER — FLUTICASONE PROPIONATE (INHAL) 250 MCG/BLIST IN AEPB: 1.0000 | INHALATION_SPRAY | Freq: Two times a day (BID) | RESPIRATORY_TRACT | 12 refills | Status: AC

## 2017-05-09 NOTE — Progress Notes (Signed)
Patient: Valerie Avila Female    DOB: 1990/02/28   27 y.o.   MRN: [id number] Visit Date: 05/09/2017  Today's Provider: Lelon Huh, MD   Chief Complaint  Patient presents with  . Sinusitis   Subjective:    Patient has had sinus pain and pressure for 6 days. Other symptoms includes: cough, sore throat, headache, left ear pain, wheezing and shortness of breath. Patient has been taking Singulair, dqyquil and nightquil with mild relief.   Sinusitis  This is a new problem. The current episode started in the past 7 days. There has been no fever. The pain is moderate. Associated symptoms include congestion, coughing, ear pain, headaches, shortness of breath, sinus pressure, sneezing, a sore throat and swollen glands. Pertinent negatives include no chills, diaphoresis, hoarse voice or neck pain. Treatments tried: dayquil and nightquil. The treatment provided mild relief.   She states she has been using albuterol inhaler 2-3 times a day which is effective for a few hours. Last dose was 5-6 hours ago. Was previously taking Advair, but has been out for a few months. Is taking Singulair, Allegra, and fluticasone nasal spray everyday, but hasn't been helping this week.      Allergies  Allergen Reactions  . Amoxicillin Rash  . Prednisone Rash    Rash on face, swelling, sick      Current Outpatient Medications:  .  albuterol (PROVENTIL HFA;VENTOLIN HFA) 108 (90 Base) MCG/ACT inhaler, Two inhalations up to four times a day for wheezing/asthma as needed., Disp: 18 g, Rfl: 1 .  ascorbic acid (VITAMIN C) 500 MG tablet, Take by mouth., Disp: , Rfl:  .  desogestrel-ethinyl estradiol (APRI) 0.15-30 MG-MCG tablet, Take 1 tablet by mouth daily., Disp: 3 Package, Rfl: 3 .  fexofenadine (ALLEGRA ALLERGY) 180 MG tablet, Take by mouth., Disp: , Rfl:  .  fluticasone (FLONASE) 50 MCG/ACT nasal spray, Place into the nose., Disp: , Rfl:  .  montelukast (SINGULAIR) 10 MG tablet, Take by mouth., Disp: ,  Rfl:  .  valACYclovir (VALTREX) 500 MG tablet, Take 1 tablet (500 mg total) by mouth daily., Disp: 30 tablet, Rfl: 3 .  Fluticasone-Salmeterol (ADVAIR DISKUS) 250-50 MCG/DOSE AEPB, Inhale 1 puff into the lungs 2 times daily at 12 noon and 4 pm for 7 days., Disp: 14 each, Rfl: 0  Review of Systems  Constitutional: Negative for appetite change, chills, diaphoresis, fatigue and fever.  HENT: Positive for congestion, ear pain, sinus pressure, sinus pain, sneezing and sore throat. Negative for hoarse voice.   Respiratory: Positive for cough, shortness of breath and wheezing. Negative for chest tightness.   Cardiovascular: Negative for chest pain and palpitations.  Gastrointestinal: Negative for abdominal pain, nausea and vomiting.  Musculoskeletal: Negative for neck pain.  Neurological: Positive for headaches. Negative for dizziness and weakness.    Social History   Tobacco Use  . Smoking status: Never Smoker  . Smokeless tobacco: Never Used  Substance Use Topics  . Alcohol use: Yes    Alcohol/week: 0.0 oz    Comment: OCCASIONALLY DRINKS BEER   Objective:   BP 120/80 (BP Location: Right Arm, Patient Position: Sitting, Cuff Size: Normal)   Pulse 100   Temp 98.5 F (36.9 C) (Oral)   Resp 16   Wt 138 lb (62.6 kg)   SpO2 98%   BMI 23.69 kg/m  Vitals:   05/09/17 1554  BP: 120/80  Pulse: 100  Resp: 16  Temp: 98.5 F (36.9 C)  TempSrc:  Oral  SpO2: 98%  Weight: 138 lb (62.6 kg)     Physical Exam  General Appearance:    Alert, cooperative, no distress  HENT:   right TM normal without fluid or infection, left TM fluid noted, neck without nodes, pharynx erythematous without exudate, frontal sinus tender and nasal mucosa pale and congested  Eyes:    PERRL, conjunctiva/corneas clear, EOM's intact       Lungs:     Clear to auscultation bilaterally, respirations unlabored  Heart:    Regular rate and rhythm  Neurologic:   Awake, alert, oriented x 3. No apparent focal neurological            defect.          Assessment & Plan:     1. Mild intermittent extrinsic asthma with acute exacerbation Not currently using maintenance inhaler but requiring rescue inhaler multiple times daily. Lungs clear at this time. Advised that she needs to stay on maintenance inhaler throughout allergy season. Previously on Advair, will start - Fluticasone Propionate, Inhal, (FLOVENT DISKUS) 250 MCG/BLIST AEPB; Inhale 1 puff into the lungs 2 (two) times daily.  Dispense: 60 each; Refill: 12  2. Sinusitis, unspecified chronicity, unspecified location  - azithromycin (ZITHROMAX) 250 MG tablet; 2 by mouth today, then 1 daily for 4 days  Dispense: 6 tablet; Refill: 0  Call if symptoms change or if not rapidly improving.     3. Allergic rhinitis due to pollen, unspecified seasonality Continue montelukast and fluticasone nasal spray.        Lelon Huh, MD  [facility]

## 2017-06-24 ENCOUNTER — Encounter: Payer: Self-pay | Admitting: Physician Assistant

## 2017-06-24 ENCOUNTER — Ambulatory Visit: Payer: Managed Care, Other (non HMO) | Admitting: Physician Assistant

## 2017-06-24 VITALS — BP 110/70 | HR 74 | Temp 98.3°F | Resp 16 | Wt 138.0 lb

## 2017-06-24 DIAGNOSIS — H01001 Unspecified blepharitis right upper eyelid: Secondary | ICD-10-CM

## 2017-06-24 MED ORDER — TRIAMCINOLONE ACETONIDE 0.1 % EX CREA: 1.0000 "application " | TOPICAL_CREAM | Freq: Two times a day (BID) | CUTANEOUS | 0 refills | Status: DC

## 2017-06-24 NOTE — Progress Notes (Signed)
Patient: Valerie Avila Female    DO: 1990/04/12   27 y.o.   MRM: [id number] Visit Date: 06/24/2017  Today's Provider: Mar Daring, PA-C   Chief Complaint  Patient presents with  . Eye Problem   Subjective:    HPI Patient here today C/O sunburn on fore head, and right eye lid swelling since yesterday and worse this morning. Patient denise any discharge or injuries. No discharge. No eye pain. No visual changes.    Allergies  Allergen Reactions  . Amoxicillin Rash  . Prednisone Rash    Rash on face, swelling, sick      Current Outpatient Medications:  .  albuterol (PROVENTIL HFA;VENTOLIN HFA) 108 (90 Base) MCG/ACT inhaler, Two inhalations up to four times a day for wheezing/asthma as needed., Disp: 18 g, Rfl: 1 .  ascorbic acid (VITAMIN C) 500 MG tablet, Take by mouth., Disp: , Rfl:  .  desogestrel-ethinyl estradiol (APRI) 0.15-30 MG-MCG tablet, Take 1 tablet by mouth daily., Disp: 3 Package, Rfl: 3 .  fexofenadine (ALLEGRA ALLERGY) 180 MG tablet, Take by mouth., Disp: , Rfl:  .  fluticasone (FLONASE) 50 MCG/ACT nasal spray, Place into the nose., Disp: , Rfl:  .  Fluticasone Propionate, Inhal, (FLOVENT DISKUS) 250 MCG/BLIST AEPB, Inhale 1 puff into the lungs 2 (two) times daily., Disp: 60 each, Rfl: 12 .  montelukast (SINGULAIR) 10 MG tablet, Take by mouth., Disp: , Rfl:  .  valACYclovir (VALTREX) 500 MG tablet, Take 1 tablet (500 mg total) by mouth daily., Disp: 30 tablet, Rfl: 3  Review of Systems  Constitutional: Negative.   HENT: Negative.   Eyes: Positive for pain (eye lid, not eyeball). Negative for photophobia, discharge, redness, itching and visual disturbance.  Respiratory: Negative.   Cardiovascular: Negative.   Skin: Positive for rash.  Neurological: Negative.     Social History   Tobacco Use  . Smoking status: Never Smoker  . Smokeless tobacco: Never Used  Substance Use Topics  . Alcohol use: Yes    Alcohol/week: 0.0 oz    Comment:  OCCASIONALLY DRINKS BEER   Objective:   There were no vitals taken for this visit. There were no vitals filed for this visit.   Physical Exam  Constitutional: She appears well-developed and well-nourished. No distress.  Eyes: Pupils are equal, round, and reactive to light. Conjunctivae and EOM are normal. Right eye exhibits no chemosis, no discharge, no exudate and no hordeolum. No foreign body present in the right eye. Left eye exhibits no chemosis, no discharge, no exudate and no hordeolum. No foreign body present in the left eye. No scleral icterus.  Right upper eyelid swollen and tender  Neck: Normal range of motion. Neck supple.  Cardiovascular: Normal rate, regular rhythm and normal heart sounds. Exam reveals no gallop and no friction rub.  No murmur heard. Pulmonary/Chest: Effort normal and breath sounds normal. No respiratory distress. She has no wheezes. She has no rales.  Lymphadenopathy:    She has no cervical adenopathy.  Skin: She is not diaphoretic.  Vitals reviewed.       Assessment & Plan:     1. Blepharitis of right upper eyelid, unspecified type Will try triamcinolone cream as below for inflammation. Advised to not get into eye for risk of blindness. Do not use for more than 5 days. Warm compresses. No eye make up until resolved. Call if eye lid becomes red, painful or develop discharge. Currently more inflammatory looking than infectious, which  is why I did not give antibiotic ointment initially. She is to call if symptoms worsen.  - triamcinolone cream (KENALOG) 0.1 %; Apply 1 application topically 2 (two) times daily. Do not use for more than 5 days  Dispense: 30 g; Refill: 0       Mar Daring, PA-C  [facility]

## 2017-06-24 NOTE — Patient Instructions (Signed)
Blepharitis Blepharitis means swollen eyelids. Follow these instructions at home: Pay attention to any changes in how you look or feel. Follow these instructions to help with your condition: Keeping Clean  Wash your hands often.  Wash your eyelids with warm water, or wash them with warm water that is mixed with little bit of baby shampoo. Do this 2 or more times per day.  Wash your face and eyebrows at least once a day.  Use a clean towel each time you dry your eyelids. Do not use the towel to clean or dry other areas of your body. Do not share your towel with anyone. General instructions  Avoid wearing makeup until you get better. Do not share makeup with anyone.  Avoid rubbing your eyes.  Put a warm compress on your eyes 2 times per day for 10 minutes at a time or as told by your doctor.  If you were told to use an medicated cream or eye drops, use the medicine as told by your doctor. Do not stop using the medicine even if you feel better.  Keep all follow-up visits as told by your doctor. This is important. Contact a doctor if:  Your eyelids feel hot.  You have blisters on your eyelids.  You have a rash on your eyelids.  The swelling does not go away in 2-4 days.  The swelling gets worse. Get help right away if:  You have pain that gets worse.  You have pain that spreads to other parts of your face.  You have redness that gets worse.  You have redness that spreads to other parts of your face.  Your vision changes.  You have pain when you look at lights or things that move.  You have a fever. This information is not intended to replace advice given to you by your health care provider. Make sure you discuss any questions you have with your health care provider. Document Released: 10/31/2007 Document Revised: 06/29/2015 Document Reviewed: 05/16/2014 Elsevier Interactive Patient Education  2018 Elsevier Inc.  

## 2017-09-26 ENCOUNTER — Ambulatory Visit: Payer: Managed Care, Other (non HMO) | Admitting: Physician Assistant

## 2017-09-26 ENCOUNTER — Encounter: Payer: Self-pay | Admitting: Physician Assistant

## 2017-09-26 VITALS — BP 110/70 | HR 95 | Temp 97.4°F | Resp 16 | Wt 135.8 lb

## 2017-09-26 DIAGNOSIS — J0100 Acute maxillary sinusitis, unspecified: Secondary | ICD-10-CM | POA: Diagnosis not present

## 2017-09-26 MED ORDER — AZITHROMYCIN 250 MG PO TABS: ORAL_TABLET | ORAL | 0 refills | Status: DC

## 2017-09-26 NOTE — Progress Notes (Signed)
Patient: Valerie Avila Female    DOB: Sep 07, 1990   27 y.o.   MRN: [id number] Visit Date: 09/26/2017  Today's Provider: Mar Daring, PA-C   Chief Complaint  Patient presents with  . URI   Subjective:    URI   This is a new problem. The current episode started in the past 7 days (Tuesday night). The problem has been gradually worsening. There has been no fever (She reports she thinks she was running a fever last night). Associated symptoms include congestion, coughing, ear pain, headaches, rhinorrhea, sinus pain, sneezing, a sore throat and wheezing. She has tried decongestant, acetaminophen and NSAIDs (Mucinex) for the symptoms. The treatment provided no relief.      Allergies  Allergen Reactions  . Amoxicillin Rash  . Prednisone Rash    Rash on face, swelling, sick      Current Outpatient Medications:  .  albuterol (PROVENTIL HFA;VENTOLIN HFA) 108 (90 Base) MCG/ACT inhaler, Two inhalations up to four times a day for wheezing/asthma as needed., Disp: 18 g, Rfl: 1 .  ascorbic acid (VITAMIN C) 500 MG tablet, Take by mouth., Disp: , Rfl:  .  desogestrel-ethinyl estradiol (APRI) 0.15-30 MG-MCG tablet, Take 1 tablet by mouth daily., Disp: 3 Package, Rfl: 3 .  fexofenadine (ALLEGRA ALLERGY) 180 MG tablet, Take by mouth., Disp: , Rfl:  .  fluticasone (FLONASE) 50 MCG/ACT nasal spray, Place into the nose., Disp: , Rfl:  .  Fluticasone Propionate, Inhal, (FLOVENT DISKUS) 250 MCG/BLIST AEPB, Inhale 1 puff into the lungs 2 (two) times daily., Disp: 60 each, Rfl: 12 .  montelukast (SINGULAIR) 10 MG tablet, Take by mouth., Disp: , Rfl:  .  valACYclovir (VALTREX) 500 MG tablet, Take 1 tablet (500 mg total) by mouth daily., Disp: 30 tablet, Rfl: 3 .  triamcinolone cream (KENALOG) 0.1 %, Apply 1 application topically 2 (two) times daily. Do not use for more than 5 days (Patient not taking: Reported on 09/26/2017), Disp: 30 g, Rfl: 0  Review of Systems  Constitutional: Positive  for fever (possible).  HENT: Positive for congestion, ear pain, postnasal drip, rhinorrhea, sinus pressure, sinus pain, sneezing, sore throat, trouble swallowing and voice change.   Respiratory: Positive for cough, chest tightness, shortness of breath and wheezing.   Neurological: Positive for headaches. Negative for dizziness.    Social History   Tobacco Use  . Smoking status: Never Smoker  . Smokeless tobacco: Never Used  Substance Use Topics  . Alcohol use: Yes    Alcohol/week: 0.0 standard drinks    Comment: OCCASIONALLY DRINKS BEER   Objective:   BP 110/70 (BP Location: Left Arm, Patient Position: Sitting, Cuff Size: Normal)   Pulse 95   Temp (!) 97.4 F (36.3 C) (Oral)   Resp 16   Wt 135 lb 12.8 oz (61.6 kg)   LMP 09/25/2017   SpO2 97%   BMI 23.31 kg/m  Vitals:   09/26/17 1319  BP: 110/70  Pulse: 95  Resp: 16  Temp: (!) 97.4 F (36.3 C)  TempSrc: Oral  SpO2: 97%  Weight: 135 lb 12.8 oz (61.6 kg)     Physical Exam  Constitutional: She appears well-developed and well-nourished. No distress.  HENT:  Head: Normocephalic and atraumatic.  Right Ear: Hearing, tympanic membrane, external ear and ear canal normal.  Left Ear: Hearing, tympanic membrane, external ear and ear canal normal.  Nose: Mucosal edema and rhinorrhea present. Right sinus exhibits maxillary sinus tenderness. Right sinus exhibits no  frontal sinus tenderness. Left sinus exhibits maxillary sinus tenderness. Left sinus exhibits no frontal sinus tenderness.  Mouth/Throat: Uvula is midline and mucous membranes are normal. Posterior oropharyngeal erythema present. No oropharyngeal exudate or posterior oropharyngeal edema.  Neck: Normal range of motion. Neck supple. No tracheal deviation present. No thyromegaly present.  Cardiovascular: Normal rate, regular rhythm and normal heart sounds. Exam reveals no gallop and no friction rub.  No murmur heard. Pulmonary/Chest: Effort normal and breath sounds normal.  No stridor. No respiratory distress. She has no wheezes. She has no rales.  Lymphadenopathy:    She has no cervical adenopathy.  Skin: She is not diaphoretic.  Vitals reviewed.      Assessment & Plan:     1. Acute maxillary sinusitis, recurrence not specified Worsening symptoms that have not responded to OTC medications. Will give Zpak as below. Continue allergy medications. Stay well hydrated and get plenty of rest. Call if no symptom improvement or if symptoms worsen. - azithromycin (ZITHROMAX) 250 MG tablet; Take 2 tablets PO on day one, and one tablet PO daily thereafter until completed.  Dispense: 6 tablet; Refill: 0       Mar Daring, PA-C  [facility]

## 2017-09-26 NOTE — Patient Instructions (Signed)

## 2017-10-01 ENCOUNTER — Telehealth: Payer: Self-pay

## 2017-10-01 DIAGNOSIS — J0100 Acute maxillary sinusitis, unspecified: Secondary | ICD-10-CM

## 2017-10-01 MED ORDER — AZITHROMYCIN 250 MG PO TABS: ORAL_TABLET | ORAL | 0 refills | Status: DC

## 2017-10-01 MED ORDER — AZITHROMYCIN 250 MG PO TABS
ORAL_TABLET | ORAL | 0 refills | Status: DC
Start: 2017-10-01 — End: 2018-02-19

## 2017-10-01 NOTE — Telephone Encounter (Signed)
Sent to [facility] [facility]

## 2017-10-01 NOTE — Telephone Encounter (Signed)
Please Review

## 2017-10-01 NOTE — Telephone Encounter (Signed)
Pt called stating she was in the office 09/26/2017 for a sinus infection.  She was prescribed a Z-pak.  She finished the RX but is still not feeling better.  She would either like another round of Zpak or a stronger antibiotic sent to [place].   Contact number:  [phone]  Thanks,   -Mickel Baas

## 2017-10-01 NOTE — Telephone Encounter (Signed)
Pt advised.   Thanks,   -Ruperto Kiernan  

## 2018-01-13 ENCOUNTER — Other Ambulatory Visit: Payer: Self-pay | Admitting: Obstetrics and Gynecology

## 2018-01-13 DIAGNOSIS — Z3041 Encounter for surveillance of contraceptive pills: Secondary | ICD-10-CM

## 2018-01-27 ENCOUNTER — Telehealth: Payer: Self-pay | Admitting: Obstetrics and Gynecology

## 2018-01-27 ENCOUNTER — Other Ambulatory Visit: Payer: Self-pay

## 2018-01-27 DIAGNOSIS — Z3041 Encounter for surveillance of contraceptive pills: Secondary | ICD-10-CM

## 2018-01-27 MED ORDER — DESOGESTREL-ETHINYL ESTRADIOL 0.15-30 MG-MCG PO TABS: 1.0000 | ORAL_TABLET | Freq: Every day | ORAL | 0 refills | Status: DC

## 2018-01-27 NOTE — Telephone Encounter (Signed)
Patient is calling needing refill on birthcontrol. patient is schedule for annual . 02/19/17 ABC

## 2018-01-27 NOTE — Telephone Encounter (Signed)
RF sent to [facility]. Pt aware.

## 2018-02-19 ENCOUNTER — Ambulatory Visit (INDEPENDENT_AMBULATORY_CARE_PROVIDER_SITE_OTHER): Payer: Managed Care, Other (non HMO) | Admitting: Obstetrics and Gynecology

## 2018-02-19 ENCOUNTER — Encounter: Payer: Self-pay | Admitting: Obstetrics and Gynecology

## 2018-02-19 ENCOUNTER — Other Ambulatory Visit (HOSPITAL_COMMUNITY)
Admission: RE | Admit: 2018-02-19 | Discharge: 2018-02-19 | Disposition: A | Discharge status: Home / Self Care | Payer: Managed Care, Other (non HMO) | Source: Ambulatory Visit | Attending: Obstetrics and Gynecology | Admitting: Obstetrics and Gynecology

## 2018-02-19 VITALS — BP 110/76 | HR 81 | Ht 64.0 in | Wt 137.0 lb

## 2018-02-19 DIAGNOSIS — Z01419 Encounter for gynecological examination (general) (routine) without abnormal findings: Secondary | ICD-10-CM | POA: Diagnosis not present

## 2018-02-19 DIAGNOSIS — Z113 Encounter for screening for infections with a predominantly sexual mode of transmission: Secondary | ICD-10-CM | POA: Insufficient documentation

## 2018-02-19 DIAGNOSIS — Z23 Encounter for immunization: Secondary | ICD-10-CM

## 2018-02-19 DIAGNOSIS — Z3041 Encounter for surveillance of contraceptive pills: Secondary | ICD-10-CM | POA: Diagnosis not present

## 2018-02-19 DIAGNOSIS — Z124 Encounter for screening for malignant neoplasm of cervix: Secondary | ICD-10-CM | POA: Diagnosis not present

## 2018-02-19 DIAGNOSIS — R21 Rash and other nonspecific skin eruption: Secondary | ICD-10-CM

## 2018-02-19 MED ORDER — DESOGESTREL-ETHINYL ESTRADIOL 0.15-30 MG-MCG PO TABS: 1.0000 | ORAL_TABLET | Freq: Every day | ORAL | 3 refills | Status: DC

## 2018-02-19 NOTE — Patient Instructions (Signed)
I value your feedback and entrusting us with your care. If you get a [facility] patient survey, I would appreciate you taking the time to let us know about your experience today. Thank you! 

## 2018-02-19 NOTE — Progress Notes (Signed)
PCP:  Margo Common, PA   Chief Complaint  Patient presents with  . Gynecologic Exam    wants flu shot, has rash on right arm since monday, itches a lot     HPI:      [name] is a 27 y.o. No obstetric history on file. who LMP was Patient's last menstrual period was 02/19/2018 (exact date)., presents today for her annual examination.  Her menses are regular every 28-30 days, lasting 4 days.  Dysmenorrhea mild, occurring first 1-2 days of flow. She does not have intermenstrual bleeding.  Sex activity: single partner, contraception - OCP (estrogen/progesterone).  Last Pap: 02/03/17 Results were: no abnormalities Hx of STDs: HSV, takes valtrex prn, done through PCP  There is no FH of breast cancer. There is no FH of ovarian cancer. The patient does do self-breast exams.  Tobacco use: The patient denies current or previous tobacco use. Alcohol use: none No drug use.  Exercise: moderately active  She does not get adequate calcium and Vitamin D in her diet.  Gardasil completed.  Pt with rash on RT arm for 4 days. Started as small erythematous, itchy patch on lower aspect and has developed new patches going up her arm. 1 lesion is linear. Pt denies any outside activity, has dog who is mostly indoors, no new soaps/detergents. Has treated with hydrocortisone crm and benadryl crm and oral tabs without relief.   Past Medical History:  Diagnosis Date  . Allergic rhinitis   . Asthma   . Genital herpes   . Vaccine for human papilloma virus (HPV) types 6, 11, 16, and 18 administered     Past Surgical History:  Procedure Laterality Date  . WISDOM TOOTH EXTRACTION      Family History  Problem Relation Age of Onset  . Healthy Mother   . Hyperlipidemia Father   . Hypertension Father   . Healthy Sister   . Heart failure Maternal Grandmother   . Pancreatic cancer Maternal Grandfather   . Heart disease Paternal Grandfather     Social History   Socioeconomic  History  . Marital status: Single    Spouse name: Not on file  . Number of children: Not on file  . Years of education: Not on file  . Highest education level: Not on file  Occupational History  . Not on file  Social Needs  . Financial resource strain: Not on file  . Food insecurity:    Worry: Not on file    Inability: Not on file  . Transportation needs:    Medical: Not on file    Non-medical: Not on file  Tobacco Use  . Smoking status: Never Smoker  . Smokeless tobacco: Never Used  Substance and Sexual Activity  . Alcohol use: Yes    Alcohol/week: 0.0 standard drinks    Comment: OCCASIONALLY DRINKS BEER  . Drug use: No  . Sexual activity: Yes    Birth control/protection: Pill  Lifestyle  . Physical activity:    Days per week: Not on file    Minutes per session: Not on file  . Stress: Not on file  Relationships  . Social connections:    Talks on phone: Not on file    Gets together: Not on file    Attends religious service: Not on file    Active member of club or organization: Not on file    Attends meetings of clubs or organizations: Not on file    Relationship status:  Not on file  . Intimate partner violence:    Fear of current or ex partner: Not on file    Emotionally abused: Not on file    Physically abused: Not on file    Forced sexual activity: Not on file  Other Topics Concern  . Not on file  Social History Narrative  . Not on file    Current Meds  Medication Sig  . albuterol (PROVENTIL HFA;VENTOLIN HFA) 108 (90 Base) MCG/ACT inhaler Two inhalations up to four times a day for wheezing/asthma as needed.  Marland Kitchen ascorbic acid (VITAMIN C) 500 MG tablet Take by mouth.  . desogestrel-ethinyl estradiol (APRI) 0.15-30 MG-MCG tablet Take 1 tablet by mouth daily.  . fexofenadine (ALLEGRA ALLERGY) 180 MG tablet Take by mouth.  . fluticasone (FLONASE) 50 MCG/ACT nasal spray Place into the nose.  Marland Kitchen Fluticasone Propionate, Inhal, (FLOVENT DISKUS) 250 MCG/BLIST AEPB  Inhale 1 puff into the lungs 2 (two) times daily.  . montelukast (SINGULAIR) 10 MG tablet Take by mouth.  . valACYclovir (VALTREX) 500 MG tablet Take 1 tablet (500 mg total) by mouth daily.  . [DISCONTINUED] desogestrel-ethinyl estradiol (APRI) 0.15-30 MG-MCG tablet Take 1 tablet by mouth daily.     ROS:  Review of Systems  Constitutional: Negative for fatigue, fever and unexpected weight change.  Respiratory: Positive for wheezing. Negative for cough and shortness of breath.   Cardiovascular: Negative for chest pain, palpitations and leg swelling.  Gastrointestinal: Negative for blood in stool, constipation, diarrhea, nausea and vomiting.  Endocrine: Negative for cold intolerance, heat intolerance and polyuria.  Genitourinary: Negative for dyspareunia, dysuria, flank pain, frequency, genital sores, hematuria, menstrual problem, pelvic pain, urgency, vaginal bleeding, vaginal discharge and vaginal pain.  Musculoskeletal: Negative for back pain, joint swelling and myalgias.  Skin: Positive for rash.  Neurological: Negative for dizziness, syncope, light-headedness, numbness and headaches.  Hematological: Negative for adenopathy.  Psychiatric/Behavioral: Positive for agitation. Negative for confusion, sleep disturbance and suicidal ideas. The patient is not nervous/anxious.      Objective: BP 110/76   Pulse 81   Ht 5\' 4"  (1.626 m)   Wt 137 lb (62.1 kg)   LMP 02/19/2018 (Exact Date)   BMI 23.52 kg/m    Physical Exam Constitutional:      Appearance: She is well-developed.  Genitourinary:     Vulva, vagina, uterus, right adnexa and left adnexa normal.     No vulval lesion or tenderness noted.     No vaginal discharge, erythema or tenderness.     No cervical motion tenderness or polyp.     Uterus is not enlarged or tender.     No right or left adnexal mass present.     Right adnexa not tender.     Left adnexa not tender.  Neck:     Musculoskeletal: Normal range of motion.      Thyroid: No thyromegaly.  Cardiovascular:     Rate and Rhythm: Normal rate and regular rhythm.     Heart sounds: Normal heart sounds. No murmur.  Pulmonary:     Effort: Pulmonary effort is normal.     Breath sounds: Normal breath sounds.  Chest:     Breasts:        Right: No mass, nipple discharge, skin change or tenderness.        Left: No mass, nipple discharge, skin change or tenderness.  Abdominal:     Palpations: Abdomen is soft.     Tenderness: There is no abdominal tenderness. There is  no guarding.  Musculoskeletal: Normal range of motion.  Neurological:     Mental Status: She is alert and oriented to person, place, and time.     Cranial Nerves: No cranial nerve deficit.  Skin:         Comments: ERYTHEMATUOUS PATCHES WITH FINE PAPULES; NO PUSTULES; NO SCALE, NO D/C  Psychiatric:        Behavior: Behavior normal.  Vitals signs reviewed.     Assessment/Plan: Encounter for annual routine gynecological examination  Cervical cancer screening - Plan: Cytology - PAP  Screening for STD (sexually transmitted disease) - Plan: Cytology - PAP  Encounter for surveillance of contraceptive pills - OCP RF - Plan: desogestrel-ethinyl estradiol (APRI) 0.15-30 MG-MCG tablet  Rash - Question chem vs allergic. Cont hydrocortisone crm/benadryl/cold compresses. Looks like poison ivy but pt denies any exposure. F/u prn  Needs flu shot - Plan: Flu Vaccine QUAD 36+ mos IM (Fluarix, Quad PF)  Meds ordered this encounter  Medications  . desogestrel-ethinyl estradiol (APRI) 0.15-30 MG-MCG tablet    Sig: Take 1 tablet by mouth daily.    Dispense:  3 Package    Refill:  3    Order Specific Question:   Supervising Provider    Answer:   Gae Dry [[id number]]             GYN counsel adequate intake of calcium and vitamin D, diet and exercise     F/U  Return in about 1 year (around 02/20/2019).  Raeli Wiens B. Janathan Bribiesca, PA-C 02/19/2018 2:51 PM

## 2018-02-23 LAB — CYTOLOGY - PAP
Chlamydia: NEGATIVE
Diagnosis: NEGATIVE
NEISSERIA GONORRHEA: NEGATIVE

## 2018-02-27 ENCOUNTER — Encounter: Payer: Self-pay | Admitting: Family Medicine

## 2018-02-27 ENCOUNTER — Ambulatory Visit (INDEPENDENT_AMBULATORY_CARE_PROVIDER_SITE_OTHER): Payer: Managed Care, Other (non HMO) | Admitting: Family Medicine

## 2018-02-27 VITALS — BP 108/60 | HR 92 | Temp 98.2°F | Resp 16 | Wt 138.6 lb

## 2018-02-27 DIAGNOSIS — L309 Dermatitis, unspecified: Secondary | ICD-10-CM

## 2018-02-27 MED ORDER — TRIAMCINOLONE ACETONIDE 0.1 % EX CREA: 1.0000 "application " | TOPICAL_CREAM | Freq: Two times a day (BID) | CUTANEOUS | 0 refills | Status: DC

## 2018-02-27 NOTE — Progress Notes (Signed)
Patient: Valerie Avila Female    DOB: 07/08/1990   28 y.o.   MRN: [id number] Visit Date: 02/27/2018  Today's Provider: Vernie Murders, PA   Chief Complaint  Patient presents with  . Rash   Subjective:     Rash  This is a new problem. The current episode started 1 to 4 weeks ago (Last Tuesday started on her right arm). The problem is unchanged. The affected locations include the left arm and right arm. The rash is characterized by itchiness and redness. It is unknown ("Impetigo exposure") if there was an exposure to a precipitant. Associated symptoms include fatigue. Pertinent negatives include no fever or joint pain. Past treatments include topical steroids (Benadryl). The treatment provided mild relief.   Past Medical History:  Diagnosis Date  . Allergic rhinitis   . Asthma   . Genital herpes   . Vaccine for human papilloma virus (HPV) types 6, 11, 16, and 18 administered    Past Surgical History:  Procedure Laterality Date  . WISDOM TOOTH EXTRACTION     Family History  Problem Relation Age of Onset  . Healthy Mother   . Hyperlipidemia Father   . Hypertension Father   . Healthy Sister   . Heart failure Maternal Grandmother   . Pancreatic cancer Maternal Grandfather   . Heart disease Paternal Grandfather    Allergies  Allergen Reactions  . Amoxicillin Rash  . Prednisone Rash    Rash on face, swelling, sick     Current Outpatient Medications:  .  albuterol (PROVENTIL HFA;VENTOLIN HFA) 108 (90 Base) MCG/ACT inhaler, Two inhalations up to four times a day for wheezing/asthma as needed., Disp: 18 g, Rfl: 1 .  ascorbic acid (VITAMIN C) 500 MG tablet, Take by mouth., Disp: , Rfl:  .  desogestrel-ethinyl estradiol (APRI) 0.15-30 MG-MCG tablet, Take 1 tablet by mouth daily., Disp: 3 Package, Rfl: 3 .  fexofenadine (ALLEGRA ALLERGY) 180 MG tablet, Take by mouth., Disp: , Rfl:  .  fluticasone (FLONASE) 50 MCG/ACT nasal spray, Place into the nose., Disp: , Rfl:  .   Fluticasone Propionate, Inhal, (FLOVENT DISKUS) 250 MCG/BLIST AEPB, Inhale 1 puff into the lungs 2 (two) times daily., Disp: 60 each, Rfl: 12 .  montelukast (SINGULAIR) 10 MG tablet, Take by mouth., Disp: , Rfl:  .  valACYclovir (VALTREX) 500 MG tablet, Take 1 tablet (500 mg total) by mouth daily., Disp: 30 tablet, Rfl: 3  Review of Systems  Constitutional: Positive for fatigue. Negative for fever.  Musculoskeletal: Negative for joint pain.  Skin: Positive for rash.   Social History   Tobacco Use  . Smoking status: Never Smoker  . Smokeless tobacco: Never Used  Substance Use Topics  . Alcohol use: Yes    Alcohol/week: 0.0 standard drinks    Comment: OCCASIONALLY DRINKS BEER     Objective:   BP 108/60 (BP Location: Right Arm, Patient Position: Sitting, Cuff Size: Normal)   Pulse 92   Temp 98.2 F (36.8 C) (Oral)   Resp 16   Wt 138 lb 9.6 oz (62.9 kg)   LMP 02/19/2018 (Exact Date)   SpO2 98%   BMI 23.79 kg/m  Vitals:   02/27/18 1546  BP: 108/60  Pulse: 92  Resp: 16  Temp: 98.2 F (36.8 C)  TempSrc: Oral  SpO2: 98%  Weight: 138 lb 9.6 oz (62.9 kg)   Physical Exam Constitutional:      General: She is not in acute distress.  Appearance: She is well-developed.  HENT:     Head: Normocephalic and atraumatic.     Right Ear: Hearing normal.     Left Ear: Hearing normal.     Nose: Nose normal.  Eyes:     General: Lids are normal. No scleral icterus.       Right eye: No discharge.        Left eye: No discharge.     Conjunctiva/sclera: Conjunctivae normal.  Pulmonary:     Effort: Pulmonary effort is normal. No respiratory distress.  Musculoskeletal: Normal range of motion.  Skin:    Findings: Rash present.     Comments: Dry pink to red patches of pruritic rash on forearms - scattered lesion.  Neurological:     Mental Status: She is alert and oriented to person, place, and time.  Psychiatric:        Speech: Speech normal.        Behavior: Behavior normal.         Thought Content: Thought content normal.       Assessment & Plan    1. Eczema, unspecified type Developed a pruritic dry erythematous rash on the forearms (R>L) approximately 10 days ago. No new, lotions, foods, medications, detergents, soaps, perfumes, etc. Boyfriend works in [employer] but unknown exposure to chemicals, insulation, adhesives, etc. Some improvement with use of Hydrocortisone and Benadryl capsules. Recommend Triamcinolone 0.1% mixed equal parts with Eucerin and applied 2-3 times a day. Recheck if no better in a week. - triamcinolone cream (KENALOG) 0.1 %; Apply 1 application topically 2 (two) times daily.  Dispense: 30 g; Refill: [place], PA  [facility]

## 2018-04-14 ENCOUNTER — Ambulatory Visit (INDEPENDENT_AMBULATORY_CARE_PROVIDER_SITE_OTHER): Payer: Managed Care, Other (non HMO) | Admitting: Physician Assistant

## 2018-04-14 ENCOUNTER — Encounter: Payer: Self-pay | Admitting: Physician Assistant

## 2018-04-14 VITALS — BP 109/70 | HR 109 | Temp 98.3°F | Wt 138.4 lb

## 2018-04-14 DIAGNOSIS — A084 Viral intestinal infection, unspecified: Secondary | ICD-10-CM

## 2018-04-14 MED ORDER — PROMETHAZINE HCL 12.5 MG PO TABS: 12.5000 mg | ORAL_TABLET | Freq: Three times a day (TID) | ORAL | 0 refills | Status: DC | PRN

## 2018-04-14 NOTE — Progress Notes (Signed)
Patient: Valerie Avila Female    DOB: 1990/05/24   27 y.o.   MRN: [id number] Visit Date: 04/14/2018  Today's Provider: Trinna Post, PA-C   Chief Complaint  Patient presents with  . Diarrhea  . Emesis   Subjective:     Diarrhea   This is a new problem. The current episode started yesterday. The problem has been unchanged. The stool consistency is described as watery. The patient states that diarrhea awakens her from sleep. Associated symptoms include abdominal pain, chills and vomiting. Nothing aggravates the symptoms. She has tried increased fluids for the symptoms. The treatment provided no relief.  Emesis   The current episode started yesterday. The problem occurs less than 2 times per day. There has been no fever. Associated symptoms include abdominal pain, chills and diarrhea.   No recent travel, hospitalizations, antibiotics. LMP start Thursday. Best friend with similar symptoms. Last ate something around 6. Not able to tolerate ginger ale. No blood in stool. Does have some abdominal pain.    Allergies  Allergen Reactions  . Amoxicillin Rash  . Prednisone Rash    Rash on face, swelling, sick      Current Outpatient Medications:  .  albuterol (PROVENTIL HFA;VENTOLIN HFA) 108 (90 Base) MCG/ACT inhaler, Two inhalations up to four times a day for wheezing/asthma as needed., Disp: 18 g, Rfl: 1 .  ascorbic acid (VITAMIN C) 500 MG tablet, Take by mouth., Disp: , Rfl:  .  desogestrel-ethinyl estradiol (APRI) 0.15-30 MG-MCG tablet, Take 1 tablet by mouth daily., Disp: 3 Package, Rfl: 3 .  fexofenadine (ALLEGRA ALLERGY) 180 MG tablet, Take by mouth., Disp: , Rfl:  .  fluticasone (FLONASE) 50 MCG/ACT nasal spray, Place into the nose., Disp: , Rfl:  .  Fluticasone Propionate, Inhal, (FLOVENT DISKUS) 250 MCG/BLIST AEPB, Inhale 1 puff into the lungs 2 (two) times daily., Disp: 60 each, Rfl: 12 .  montelukast (SINGULAIR) 10 MG tablet, Take by mouth., Disp: , Rfl:  .   triamcinolone cream (KENALOG) 0.1 %, Apply 1 application topically 2 (two) times daily., Disp: 30 g, Rfl: 0 .  valACYclovir (VALTREX) 500 MG tablet, Take 1 tablet (500 mg total) by mouth daily., Disp: 30 tablet, Rfl: 3  Review of Systems  Constitutional: Positive for chills.  HENT: Negative.   Gastrointestinal: Positive for abdominal pain, diarrhea and vomiting.  Neurological: Negative.     Social History   Tobacco Use  . Smoking status: Never Smoker  . Smokeless tobacco: Never Used  Substance Use Topics  . Alcohol use: Yes    Alcohol/week: 0.0 standard drinks    Comment: OCCASIONALLY DRINKS BEER      Objective:   BP 109/70 (BP Location: Left Arm, Patient Position: Sitting, Cuff Size: Normal)   Pulse (!) 109   Temp 98.3 F (36.8 C) (Oral)   Wt 138 lb 6.4 oz (62.8 kg)   LMP 03/16/2018   BMI 23.76 kg/m  Vitals:   04/14/18 1108  BP: 109/70  Pulse: (!) 109  Temp: 98.3 F (36.8 C)  TempSrc: Oral  Weight: 138 lb 6.4 oz (62.8 kg)     Physical Exam Constitutional:      Appearance: Normal appearance. She is normal weight.  Cardiovascular:     Rate and Rhythm: Normal rate and regular rhythm.  Pulmonary:     Effort: Pulmonary effort is normal.     Breath sounds: Normal breath sounds.  Abdominal:     General: Abdomen is flat.  Bowel sounds are normal.     Palpations: Abdomen is soft.     Tenderness: There is no abdominal tenderness.  Skin:    General: Skin is warm and dry.  Neurological:     Mental Status: She is alert and oriented to person, place, and time. Mental status is at baseline.  Psychiatric:        Mood and Affect: Mood normal.        Behavior: Behavior normal.         Assessment & Plan    1. Viral gastroenteritis  Counseled on symptomatic treatment, nausea medication as below and work note provided.  - promethazine (PHENERGAN) 12.5 MG tablet; Take 1 tablet (12.5 mg total) by mouth every 8 (eight) hours as needed for nausea or vomiting.  Dispense:  20 tablet; Refill: 0  The entirety of the information documented in the History of Present Illness, Review of Systems and Physical Exam were personally obtained by me. Portions of this information were initially documented by [facility], CMA and reviewed by me for thoroughness and accuracy.   Return if symptoms worsen or fail to improve.        Trinna Post, PA-C  [facility]

## 2018-04-14 NOTE — Patient Instructions (Signed)
Viral Gastroenteritis, Adult    Viral gastroenteritis is also known as the stomach flu. This condition is caused by certain germs (viruses). These germs can be passed from person to person very easily (are very contagious). This condition can cause sudden watery poop (diarrhea), fever, and throwing up (vomiting).  Having watery poop and throwing up can make you feel weak and cause you to get dehydrated. Dehydration can make you tired and thirsty, make you have a dry mouth, and make it so you pee (urinate) less often. Older adults and people with other diseases or a weak defense system (immune system) are at higher risk for dehydration. It is important to replace the fluids that you lose from having watery poop and throwing up.  Follow these instructions at home:  Follow instructions from your doctor about how to care for yourself at home.  Eating and drinking  Follow these instructions as told by your doctor:   Take an oral rehydration solution (ORS). This is a drink that is sold at pharmacies and stores.   Drink clear fluids in small amounts as you are able, such as:  ? Water.  ? Ice chips.  ? Diluted fruit juice.  ? Low-calorie sports drinks.   Eat bland, easy-to-digest foods in small amounts as you are able, such as:  ? Bananas.  ? Applesauce.  ? Rice.  ? Low-fat (lean) meats.  ? Toast.  ? Crackers.   Avoid fluids that have a lot of sugar or caffeine in them.   Avoid alcohol.   Avoid spicy or fatty foods.  General instructions     Drink enough fluid to keep your pee (urine) clear or pale yellow.   Wash your hands often. If you cannot use soap and water, use hand sanitizer.   Make sure that all people in your home wash their hands well and often.   Rest at home while you get better.   Take over-the-counter and prescription medicines only as told by your doctor.   Watch your condition for any changes.   Take a warm bath to help with any burning or pain from having watery poop.   Keep all follow-up  visits as told by your doctor. This is important.  Contact a doctor if:   You cannot keep fluids down.   Your symptoms get worse.   You have new symptoms.   You feel light-headed or dizzy.   You have muscle cramps.  Get help right away if:   You have chest pain.   You feel very weak or you pass out (faint).   You see blood in your throw-up.   Your throw-up looks like coffee grounds.   You have bloody or black poop (stools) or poop that look like tar.   You have a very bad headache, a stiff neck, or both.   You have a rash.   You have very bad pain, cramping, or bloating in your belly (abdomen).   You have trouble breathing.   You are breathing very quickly.   Your heart is beating very quickly.   Your skin feels cold and clammy.   You feel confused.   You have pain when you pee.   You have signs of dehydration, such as:  ? Dark pee, hardly any pee, or no pee.  ? Cracked lips.  ? Dry mouth.  ? Sunken eyes.  ? Sleepiness.  ? Weakness.  This information is not intended to replace advice given to you by your   health care provider. Make sure you discuss any questions you have with your health care provider.  Document Released: 07/10/2007 Document Revised: 10/15/2017 Document Reviewed: 09/27/2014  Elsevier Interactive Patient Education  2019 [employer]

## 2018-10-31 IMAGING — CR DG HAND COMPLETE 3+V*R*
1 series · 3 of 3 positions shown · non-contrast
Comparison: No recent prior .

CLINICAL DATA: Pain and swelling.  Possible injury.

EXAM:
RIGHT HAND - COMPLETE 3+ VIEW

[Series 1: dg hand complete right · 0.14mm/px · 3 of 3 slices shown]
[im 1/3]
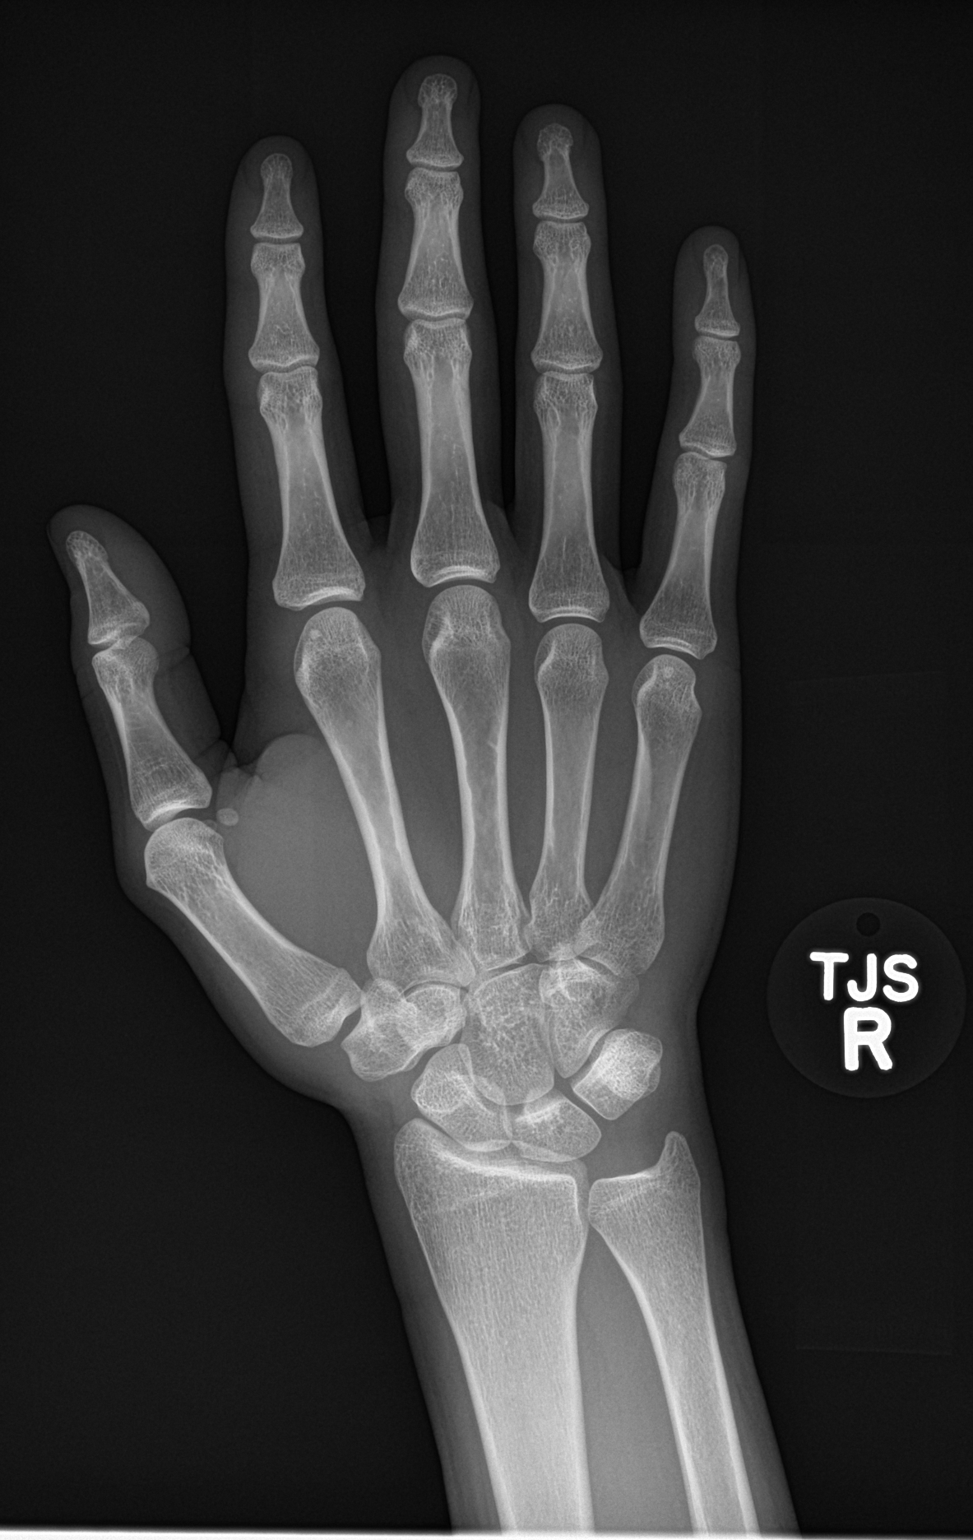
[im 2/3]
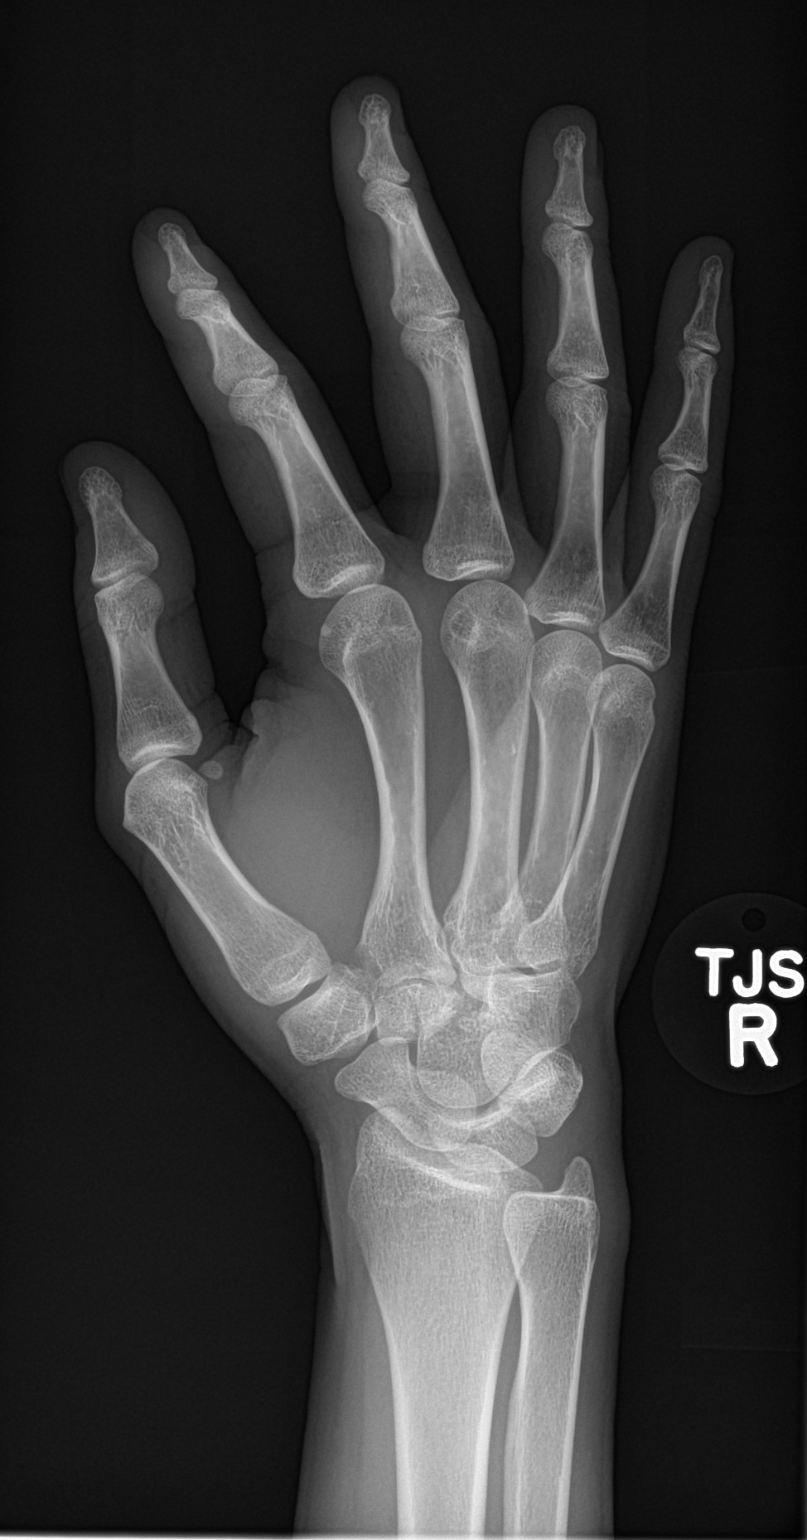
[im 3/3]
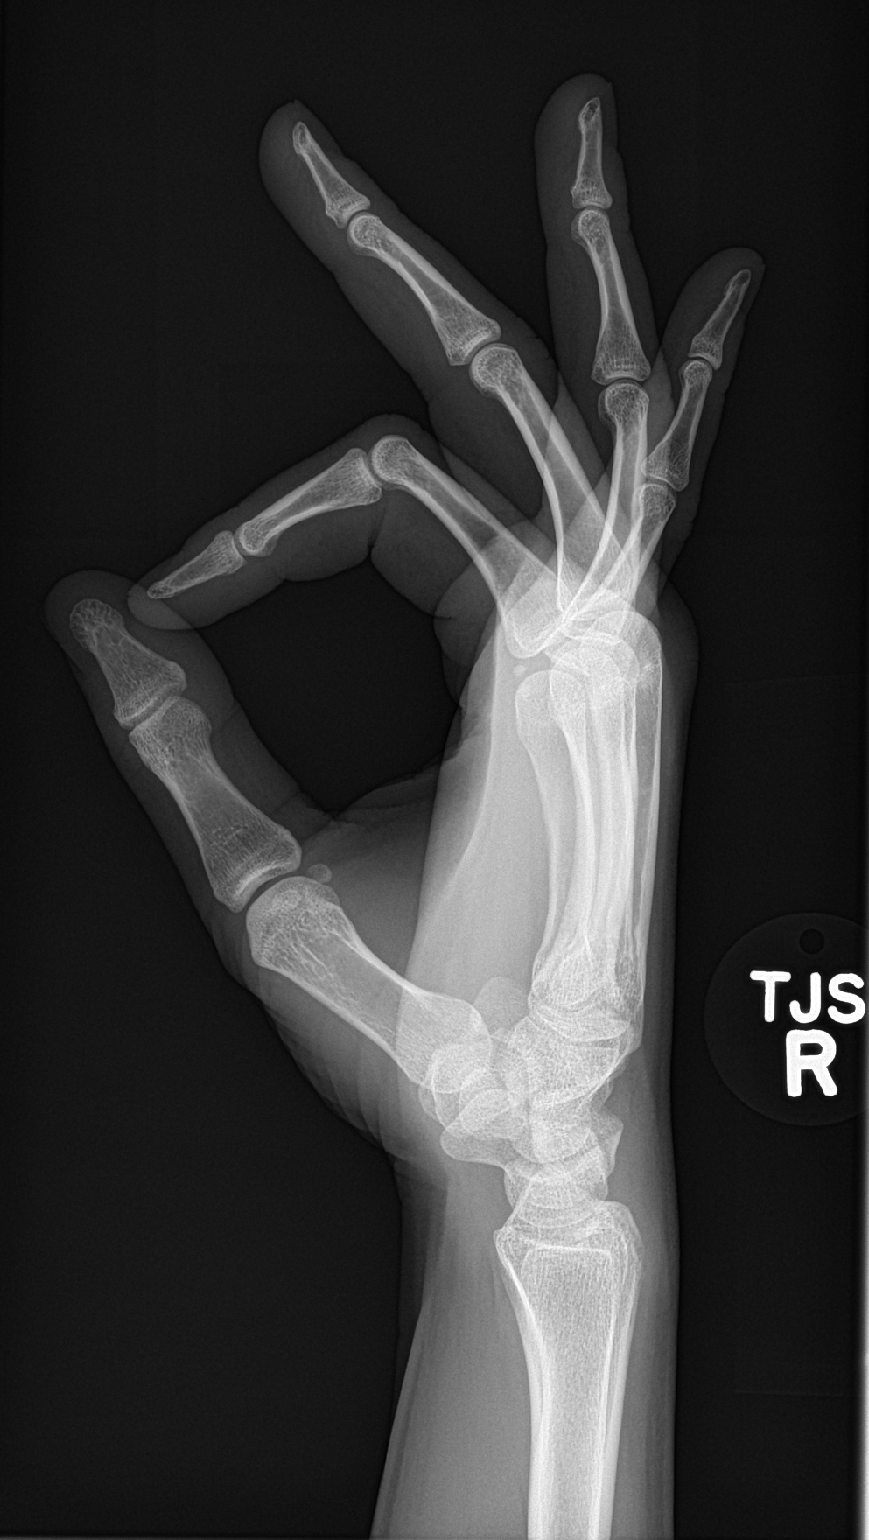

[3 of 3 positions shown; findings below may reference images not displayed]

FINDINGS: No acute bony or joint abnormality identified. No evidence of
fracture dislocation.
IMPRESSION: No acute or focal abnormality.

## 2019-01-04 ENCOUNTER — Encounter: Payer: Self-pay | Admitting: Family Medicine

## 2019-01-04 ENCOUNTER — Other Ambulatory Visit: Payer: Self-pay

## 2019-01-04 ENCOUNTER — Ambulatory Visit (INDEPENDENT_AMBULATORY_CARE_PROVIDER_SITE_OTHER): Payer: Managed Care, Other (non HMO) | Admitting: Family Medicine

## 2019-01-04 DIAGNOSIS — J0100 Acute maxillary sinusitis, unspecified: Secondary | ICD-10-CM

## 2019-01-04 DIAGNOSIS — J4521 Mild intermittent asthma with (acute) exacerbation: Secondary | ICD-10-CM

## 2019-01-04 MED ORDER — ALBUTEROL SULFATE HFA 108 (90 BASE) MCG/ACT IN AERS: INHALATION_SPRAY | RESPIRATORY_TRACT | 1 refills | Status: DC

## 2019-01-04 MED ORDER — AZITHROMYCIN 250 MG PO TABS: ORAL_TABLET | ORAL | 0 refills | Status: DC

## 2019-01-04 NOTE — Progress Notes (Signed)
Valerie Avila  MRN: [id number] DOB: [birth year]  Subjective:  HPI   The patient is a 28 year old female who presents via electronic device for evaluation of probable sinus infection.  She states she gets this every year about this time.  She started on 01/01/19 having symptoms of head congestion, drainage, stuffy nose and green nasal congestion.  She states in the morning it is worse and she has blood mixed in with the congestion.   She denies any fever or shortness of breath. Virtual Visit via Video Note  I connected with Valerie Avila on 01/04/19 at  2:00 PM EST by a video enabled telemedicine application and verified that I am speaking with the correct person using two identifiers.  Location: Patient: Home Provider: Office   I discussed the limitations of evaluation and management by telemedicine and the availability of in person appointments. The patient expressed understanding and agreed to proceed.   Patient Active Problem List   Diagnosis Date Noted  . Allergic rhinitis 05/09/2017  . Acute stress disorder 12/15/2014  . Anxiety 12/15/2014  . Genital herpes 12/15/2014  . High risk sexual behavior 12/15/2014  . Frequent UTI 12/15/2014  . Asthma, exogenous 02/05/2003    Past Medical History:  Diagnosis Date  . Allergic rhinitis   . Asthma   . Genital herpes   . Vaccine for human papilloma virus (HPV) types 6, 11, 16, and 18 administered     Social History   Socioeconomic History  . Marital status: Single    Spouse name: Not on file  . Number of children: Not on file  . Years of education: Not on file  . Highest education level: Not on file  Occupational History  . Not on file  Social Needs  . Financial resource strain: Not on file  . Food insecurity    Worry: Not on file    Inability: Not on file  . Transportation needs    Medical: Not on file    Non-medical: Not on file  Tobacco Use  . Smoking status: Never Smoker  . Smokeless tobacco: Never Used   Substance and Sexual Activity  . Alcohol use: Yes    Alcohol/week: 0.0 standard drinks    Comment: OCCASIONALLY DRINKS BEER  . Drug use: No  . Sexual activity: Yes    Birth control/protection: Pill  Lifestyle  . Physical activity    Days per week: Not on file    Minutes per session: Not on file  . Stress: Not on file  Relationships  . Social Herbalist on phone: Not on file    Gets together: Not on file    Attends religious service: Not on file    Active member of club or organization: Not on file    Attends meetings of clubs or organizations: Not on file    Relationship status: Not on file  . Intimate partner violence    Fear of current or ex partner: Not on file    Emotionally abused: Not on file    Physically abused: Not on file    Forced sexual activity: Not on file  Other Topics Concern  . Not on file  Social History Narrative  . Not on file    Outpatient Encounter Medications as of 01/04/2019  Medication Sig Note  . albuterol (PROVENTIL HFA;VENTOLIN HFA) 108 (90 Base) MCG/ACT inhaler Two inhalations up to four times a day for wheezing/asthma as needed.   Marland Kitchen ascorbic acid (  VITAMIN C) 500 MG tablet Take by mouth. 12/15/2014: Received from: [facility]  . desogestrel-ethinyl estradiol (APRI) 0.15-30 MG-MCG tablet Take 1 tablet by mouth daily.   . fexofenadine (ALLEGRA ALLERGY) 180 MG tablet Take by mouth. 12/15/2014: Received from: [facility]  . fluticasone (FLONASE) 50 MCG/ACT nasal spray Place into the nose. 12/15/2014: Received from: [facility]  . Fluticasone Propionate, Inhal, (FLOVENT DISKUS) 250 MCG/BLIST AEPB Inhale 1 puff into the lungs 2 (two) times daily.   . montelukast (SINGULAIR) 10 MG tablet Take by mouth. 12/15/2014: Received from: [facility]  . promethazine (PHENERGAN) 12.5 MG tablet Take 1 tablet (12.5 mg total) by mouth every 8 (eight) hours as needed for nausea or  vomiting.   . triamcinolone cream (KENALOG) 0.1 % Apply 1 application topically 2 (two) times daily.   . valACYclovir (VALTREX) 500 MG tablet Take 1 tablet (500 mg total) by mouth daily.    No facility-administered encounter medications on file as of 01/04/2019.     Allergies  Allergen Reactions  . Amoxicillin Rash  . Prednisone Rash    Rash on face, swelling, sick     Review of Systems  Constitutional: Negative for chills, diaphoresis, fever and malaise/fatigue.  HENT: Positive for congestion, ear pain, sinus pain and sore throat. Negative for ear discharge, hearing loss, nosebleeds and tinnitus.   Respiratory: Negative for cough, shortness of breath and wheezing.   Cardiovascular: Negative for chest pain and palpitations.  Gastrointestinal: Negative for abdominal pain and diarrhea.  Musculoskeletal: Negative for myalgias.  Neurological: Positive for headaches. Negative for dizziness.    Objective:  There were no vitals taken for this visit.  WDWN female in no apparent distress.  Head: Normocephalic, atraumatic. Neck: Supple, NROM Respiratory: No apparent distress Psych: Normal mood and affect   Assessment and Plan :   1. Acute maxillary sinusitis, recurrence not specified Developed PND, maxillary sinus pressure headache, with sore throat and some bloody mucus when she blows her nose 2 days ago. No fever or cough. No loss of taste or smell. Employer sent her home today with these symptoms due to COVID restrictions. May use Mucinex-DM if any cough develops and continue allergy meds (Flonase, Singulair and Alegra). Add Z-pak, quarantine and get COVID test if symptoms worsen or fever develops. - azithromycin (ZITHROMAX) 250 MG tablet; Take 2 tablets by mouth the first day, then one tablet daily for 4 days.  Dispense: 6 tablet; Refill: 0  2. Mild intermittent extrinsic asthma with acute exacerbation Slight wheeze and using the Albuterol once in the past couple days. No fever or  sputum production. - albuterol (VENTOLIN HFA) 108 (90 Base) MCG/ACT inhaler; Two inhalations up to four times a day for wheezing/asthma as needed.  Dispense: 18 g; Refill: 1  I discussed the assessment and treatment plan with the patient. The patient was provided an opportunity to ask questions and all were answered. The patient agreed with the plan and demonstrated an understanding of the instructions.   The patient was advised to call back or seek an in-person evaluation if the symptoms worsen or if the condition fails to improve as anticipated.  I provided 15 minutes of non-face-to-face time during this encounter.

## 2019-03-01 ENCOUNTER — Other Ambulatory Visit: Payer: Self-pay | Admitting: Obstetrics and Gynecology

## 2019-03-01 DIAGNOSIS — Z3041 Encounter for surveillance of contraceptive pills: Secondary | ICD-10-CM

## 2019-03-02 ENCOUNTER — Other Ambulatory Visit: Payer: Self-pay

## 2019-03-02 DIAGNOSIS — Z3041 Encounter for surveillance of contraceptive pills: Secondary | ICD-10-CM

## 2019-03-02 MED ORDER — APRI 0.15-30 MG-MCG PO TABS: 1.0000 | ORAL_TABLET | Freq: Every day | ORAL | 3 refills | Status: DC

## 2019-03-16 ENCOUNTER — Ambulatory Visit (INDEPENDENT_AMBULATORY_CARE_PROVIDER_SITE_OTHER): Payer: Managed Care, Other (non HMO) | Admitting: Obstetrics and Gynecology

## 2019-03-16 ENCOUNTER — Other Ambulatory Visit: Payer: Self-pay

## 2019-03-16 ENCOUNTER — Encounter: Payer: Self-pay | Admitting: Obstetrics and Gynecology

## 2019-03-16 VITALS — BP 90/70 | Ht 64.0 in | Wt 145.0 lb

## 2019-03-16 DIAGNOSIS — Z23 Encounter for immunization: Secondary | ICD-10-CM | POA: Diagnosis not present

## 2019-03-16 DIAGNOSIS — Z3041 Encounter for surveillance of contraceptive pills: Secondary | ICD-10-CM

## 2019-03-16 DIAGNOSIS — Z01419 Encounter for gynecological examination (general) (routine) without abnormal findings: Secondary | ICD-10-CM

## 2019-03-16 MED ORDER — DESOGESTREL-ETHINYL ESTRADIOL 0.15-30 MG-MCG PO TABS: 1.0000 | ORAL_TABLET | Freq: Every day | ORAL | 3 refills | Status: DC

## 2019-03-16 NOTE — Progress Notes (Signed)
PCP:  Margo Common, PA   Chief Complaint  Patient presents with  . Gynecologic Exam    flu shot     HPI:      [name] is a 29 y.o. No obstetric history on file. who LMP was Patient's last menstrual period was 02/18/2019 (approximate)., presents today for her annual examination.  Her menses are regular every 28-30 days, lasting 3-4 days.  Dysmenorrhea mild to none. She does not have intermenstrual bleeding.  Sex activity: single partner, contraception - OCP (estrogen/progesterone).  Last Pap: 02/19/18 Results were: no abnormalities Hx of STDs: HSV, takes valtrex prn, done through PCP  There is no FH of breast cancer. There is no FH of ovarian cancer. The patient does do self-breast exams.  Tobacco use: The patient denies current or previous tobacco use. Alcohol use: social No drug use.  Exercise: moderately active  She does get adequate calcium but not Vitamin D in her diet.  Past Medical History:  Diagnosis Date  . Allergic rhinitis   . Asthma   . Genital herpes   . Vaccine for human papilloma virus (HPV) types 6, 11, 16, and 18 administered     Past Surgical History:  Procedure Laterality Date  . WISDOM TOOTH EXTRACTION      Family History  Problem Relation Age of Onset  . Healthy Mother   . Hyperlipidemia Father   . Hypertension Father   . Healthy Sister   . Heart failure Maternal Grandmother   . Pancreatic cancer Maternal Grandfather   . Heart disease Paternal Grandfather     Social History   Socioeconomic History  . Marital status: Single    Spouse name: Not on file  . Number of children: Not on file  . Years of education: Not on file  . Highest education level: Not on file  Occupational History  . Not on file  Tobacco Use  . Smoking status: Never Smoker  . Smokeless tobacco: Never Used  Substance and Sexual Activity  . Alcohol use: Yes    Alcohol/week: 0.0 standard drinks    Comment: OCCASIONALLY DRINKS BEER  . Drug use: No   . Sexual activity: Yes    Birth control/protection: Pill  Other Topics Concern  . Not on file  Social History Narrative  . Not on file   Social Determinants of Health   Financial Resource Strain:   . Difficulty of Paying Living Expenses: Not on file  Food Insecurity:   . Worried About Charity fundraiser in the Last Year: Not on file  . Ran Out of Food in the Last Year: Not on file  Transportation Needs:   . Lack of Transportation (Medical): Not on file  . Lack of Transportation (Non-Medical): Not on file  Physical Activity:   . Days of Exercise per Week: Not on file  . Minutes of Exercise per Session: Not on file  Stress:   . Feeling of Stress : Not on file  Social Connections:   . Frequency of Communication with Friends and Family: Not on file  . Frequency of Social Gatherings with Friends and Family: Not on file  . Attends Religious Services: Not on file  . Active Member of Clubs or Organizations: Not on file  . Attends Archivist Meetings: Not on file  . Marital Status: Not on file  Intimate Partner Violence:   . Fear of Current or Ex-Partner: Not on file  . Emotionally Abused: Not on file  .  Physically Abused: Not on file  . Sexually Abused: Not on file    Current Meds  Medication Sig  . albuterol (VENTOLIN HFA) 108 (90 Base) MCG/ACT inhaler Two inhalations up to four times a day for wheezing/asthma as needed.  Marland Kitchen ascorbic acid (VITAMIN C) 500 MG tablet Take by mouth.  . desogestrel-ethinyl estradiol (APRI) 0.15-30 MG-MCG tablet Take 1 tablet by mouth daily.  . fexofenadine (ALLEGRA ALLERGY) 180 MG tablet Take by mouth.  . fluticasone (FLONASE) 50 MCG/ACT nasal spray Place into the nose.  Marland Kitchen Fluticasone Propionate, Inhal, (FLOVENT DISKUS) 250 MCG/BLIST AEPB Inhale 1 puff into the lungs 2 (two) times daily.  . montelukast (SINGULAIR) 10 MG tablet Take by mouth.  . triamcinolone cream (KENALOG) 0.1 % Apply 1 application topically 2 (two) times daily.  .  valACYclovir (VALTREX) 500 MG tablet Take 1 tablet (500 mg total) by mouth daily.  . [DISCONTINUED] desogestrel-ethinyl estradiol (APRI) 0.15-30 MG-MCG tablet Take 1 tablet by mouth daily.     ROS:  Review of Systems  Constitutional: Negative for fatigue, fever and unexpected weight change.  Respiratory: Negative for cough, shortness of breath and wheezing.   Cardiovascular: Negative for chest pain, palpitations and leg swelling.  Gastrointestinal: Negative for blood in stool, constipation, diarrhea, nausea and vomiting.  Endocrine: Negative for cold intolerance, heat intolerance and polyuria.  Genitourinary: Negative for dyspareunia, dysuria, flank pain, frequency, genital sores, hematuria, menstrual problem, pelvic pain, urgency, vaginal bleeding, vaginal discharge and vaginal pain.  Musculoskeletal: Negative for back pain, joint swelling and myalgias.  Skin: Positive for rash.  Neurological: Negative for dizziness, syncope, light-headedness, numbness and headaches.  Hematological: Negative for adenopathy.  Psychiatric/Behavioral: Negative for agitation, confusion, sleep disturbance and suicidal ideas. The patient is not nervous/anxious.      Objective: BP 90/70   Ht 5\' 4"  (1.626 m)   Wt 145 lb (65.8 kg)   LMP 02/18/2019 (Approximate)   BMI 24.89 kg/m    Physical Exam Constitutional:      Appearance: She is well-developed.  Genitourinary:     Vulva, vagina, cervix, uterus, right adnexa and left adnexa normal.     No vulval lesion or tenderness noted.     No vaginal discharge, erythema or tenderness.     No cervical polyp.     Uterus is not enlarged or tender.     No right or left adnexal mass present.     Right adnexa not tender.     Left adnexa not tender.  Neck:     Thyroid: No thyromegaly.  Cardiovascular:     Rate and Rhythm: Normal rate and regular rhythm.     Heart sounds: Normal heart sounds. No murmur.  Pulmonary:     Effort: Pulmonary effort is normal.      Breath sounds: Normal breath sounds.  Chest:     Breasts:        Right: No mass, nipple discharge, skin change or tenderness.        Left: No mass, nipple discharge, skin change or tenderness.  Abdominal:     Palpations: Abdomen is soft.     Tenderness: There is no abdominal tenderness. There is no guarding.  Musculoskeletal:        General: Normal range of motion.     Cervical back: Normal range of motion.  Neurological:     General: No focal deficit present.     Mental Status: She is alert and oriented to person, place, and time.  Cranial Nerves: No cranial nerve deficit.  Skin:    General: Skin is warm and dry.  Psychiatric:        Mood and Affect: Mood normal.        Behavior: Behavior normal.        Thought Content: Thought content normal.        Judgment: Judgment normal.  Vitals reviewed.     Assessment/Plan: Encounter for annual routine gynecological examination  Encounter for surveillance of contraceptive pills - OCP RF - Plan: desogestrel-ethinyl estradiol (APRI) 0.15-30 MG-MCG tablet  Needs flu shot - Plan: Flu Vaccine QUAD 36+ mos IM (Fluarix, Quad PF)  Meds ordered this encounter  Medications  . desogestrel-ethinyl estradiol (APRI) 0.15-30 MG-MCG tablet    Sig: Take 1 tablet by mouth daily.    Dispense:  3 Package    Refill:  3    Order Specific Question:   Supervising Provider    Answer:   Gae Dry [id number]             GYN counsel adequate intake of calcium and vitamin D, diet and exercise     F/U  Return in about 1 year (around 03/15/2020).  Aric Jost B. Charleigh Correnti, PA-C 03/16/2019 2:24 PM

## 2019-03-16 NOTE — Patient Instructions (Signed)
I value your feedback and entrusting us with your care. If you get a [facility] patient survey, I would appreciate you taking the time to let us know about your experience today. Thank you!  As of January 14, 2019, your lab results will be released to your MyChart immediately, before I even have a chance to see them. Please give me time to review them and contact you if there are any abnormalities. Thank you for your patience.  

## 2019-03-22 ENCOUNTER — Ambulatory Visit (INDEPENDENT_AMBULATORY_CARE_PROVIDER_SITE_OTHER): Payer: Managed Care, Other (non HMO) | Admitting: Family Medicine

## 2019-03-22 ENCOUNTER — Encounter: Payer: Self-pay | Admitting: Family Medicine

## 2019-03-22 ENCOUNTER — Other Ambulatory Visit: Payer: Self-pay

## 2019-03-22 DIAGNOSIS — J0100 Acute maxillary sinusitis, unspecified: Secondary | ICD-10-CM

## 2019-03-22 MED ORDER — AZITHROMYCIN 250 MG PO TABS: ORAL_TABLET | ORAL | 0 refills | Status: DC

## 2019-03-22 NOTE — Progress Notes (Signed)
Valerie Avila  MRN: [id number] DOB: 07-07-1990  Subjective:  HPI  The patient is a 29 year old female who presents via phone due to difficulty making connection through video. She complains that for 4-5 days she has been having symptoms of Headache, congestion, ear fullness and teeth hurting.    She has history of asthma and states that between these symptoms and having to wear a mask she feels she if progressively getting worse.   Virtual Visit via Telephone Note  I connected with Valerie Avila on 03/22/19 at  2:00 PM EST by telephone and verified that I am speaking with the correct person using two identifiers.  Location: Patient: home Provider: office   I discussed the limitations, risks, security and privacy concerns of performing an evaluation and management service by telephone and the availability of in person appointments. I also discussed with the patient that there may be a patient responsible charge related to this service. The patient expressed understanding and agreed to proceed.  Patient Active Problem List   Diagnosis Date Noted  . Allergic rhinitis 05/09/2017  . Acute stress disorder 12/15/2014  . Anxiety 12/15/2014  . Genital herpes 12/15/2014  . High risk sexual behavior 12/15/2014  . Frequent UTI 12/15/2014  . Asthma, exogenous 02/05/2003    Past Medical History:  Diagnosis Date  . Allergic rhinitis   . Asthma   . Genital herpes   . Vaccine for human papilloma virus (HPV) types 6, 11, 16, and 18 administered     Social History   Socioeconomic History  . Marital status: Single    Spouse name: Not on file  . Number of children: Not on file  . Years of education: Not on file  . Highest education level: Not on file  Occupational History  . Not on file  Tobacco Use  . Smoking status: Never Smoker  . Smokeless tobacco: Never Used  Substance and Sexual Activity  . Alcohol use: Yes    Alcohol/week: 0.0 standard drinks    Comment: OCCASIONALLY  DRINKS BEER  . Drug use: No  . Sexual activity: Yes    Birth control/protection: Pill  Other Topics Concern  . Not on file  Social History Narrative  . Not on file   Social Determinants of Health   Financial Resource Strain:   . Difficulty of Paying Living Expenses: Not on file  Food Insecurity:   . Worried About Charity fundraiser in the Last Year: Not on file  . Ran Out of Food in the Last Year: Not on file  Transportation Needs:   . Lack of Transportation (Medical): Not on file  . Lack of Transportation (Non-Medical): Not on file  Physical Activity:   . Days of Exercise per Week: Not on file  . Minutes of Exercise per Session: Not on file  Stress:   . Feeling of Stress : Not on file  Social Connections:   . Frequency of Communication with Friends and Family: Not on file  . Frequency of Social Gatherings with Friends and Family: Not on file  . Attends Religious Services: Not on file  . Active Member of Clubs or Organizations: Not on file  . Attends Archivist Meetings: Not on file  . Marital Status: Not on file  Intimate Partner Violence:   . Fear of Current or Ex-Partner: Not on file  . Emotionally Abused: Not on file  . Physically Abused: Not on file  . Sexually Abused: Not on  file    Outpatient Encounter Medications as of 03/22/2019  Medication Sig Note  . albuterol (VENTOLIN HFA) 108 (90 Base) MCG/ACT inhaler Two inhalations up to four times a day for wheezing/asthma as needed.   Marland Kitchen ascorbic acid (VITAMIN C) 500 MG tablet Take by mouth. 12/15/2014: Received from: [facility]  . desogestrel-ethinyl estradiol (APRI) 0.15-30 MG-MCG tablet Take 1 tablet by mouth daily.   . fexofenadine (ALLEGRA ALLERGY) 180 MG tablet Take by mouth. 12/15/2014: Received from: [facility]  . fluticasone (FLONASE) 50 MCG/ACT nasal spray Place into the nose. 12/15/2014: Received from: [facility]  . Fluticasone Propionate,  Inhal, (FLOVENT DISKUS) 250 MCG/BLIST AEPB Inhale 1 puff into the lungs 2 (two) times daily.   . montelukast (SINGULAIR) 10 MG tablet Take by mouth. 12/15/2014: Received from: [facility]  . promethazine (PHENERGAN) 12.5 MG tablet Take 1 tablet (12.5 mg total) by mouth every 8 (eight) hours as needed for nausea or vomiting. (Patient not taking: Reported on 03/16/2019)   . triamcinolone cream (KENALOG) 0.1 % Apply 1 application topically 2 (two) times daily.   . valACYclovir (VALTREX) 500 MG tablet Take 1 tablet (500 mg total) by mouth daily.    No facility-administered encounter medications on file as of 03/22/2019.    Allergies  Allergen Reactions  . Amoxicillin Rash  . Prednisone Rash    Rash on face, swelling, sick     Review of Systems  Constitutional: Negative for chills, diaphoresis and malaise/fatigue.  HENT: Positive for congestion, ear pain, sinus pain and sore throat. Negative for ear discharge.   Respiratory: Positive for cough (non productive). Negative for sputum production, shortness of breath and wheezing.   Cardiovascular: Negative for chest pain and palpitations.  Neurological: Positive for headaches. Negative for dizziness.    Objective:  There were no vitals taken for this visit.  Physical Exam: No acute respiratory distress. No wheezing or cough during telephonic interview.  Assessment and Plan :  1. Acute maxillary sinusitis, recurrence not specified Developed maxillary and frontal sinus pressure headache with ear pressure and throat irritation from PND over the past 2-3 days. Using Flonase and Allegra for allergic rhinitis and Albuterol with Singulair and Flovent for asthma. Denies any fever, loss of taste, cough or fatigue. Counseled regarding COVID symptoms and when to get tested if symptoms appear. May use Tylenol or Advil prn and increase fluid intake. Will add Z-pak and recheck prn. - azithromycin (ZITHROMAX) 250 MG tablet; Take 2 tablets by  mouth today then one daily for 4 days.  Dispense: 6 tablet; Refill: 0     I discussed the assessment and treatment plan with the patient. The patient was provided an opportunity to ask questions and all were answered. The patient agreed with the plan and demonstrated an understanding of the instructions.   The patient was advised to call back or seek an in-person evaluation if the symptoms worsen or if the condition fails to improve as anticipated.  I provided 15 minutes of non-face-to-face time during this encounter.

## 2020-02-13 ENCOUNTER — Other Ambulatory Visit: Payer: Self-pay | Admitting: Obstetrics and Gynecology

## 2020-02-13 DIAGNOSIS — Z3041 Encounter for surveillance of contraceptive pills: Secondary | ICD-10-CM

## 2020-02-16 ENCOUNTER — Encounter: Payer: Self-pay | Admitting: Physician Assistant

## 2020-02-16 ENCOUNTER — Ambulatory Visit (INDEPENDENT_AMBULATORY_CARE_PROVIDER_SITE_OTHER): Payer: Managed Care, Other (non HMO) | Admitting: Physician Assistant

## 2020-02-16 DIAGNOSIS — J0100 Acute maxillary sinusitis, unspecified: Secondary | ICD-10-CM

## 2020-02-16 MED ORDER — AZITHROMYCIN 250 MG PO TABS: ORAL_TABLET | ORAL | 0 refills | Status: DC

## 2020-02-16 NOTE — Progress Notes (Signed)
Virtual telephone visit    Virtual Visit via Telephone Note   This visit type was conducted due to national recommendations for restrictions regarding the COVID-19 Pandemic (e.g. social distancing) in an effort to limit this patient's exposure and mitigate transmission in our community. Due to her co-morbid illnesses, this patient is at least at moderate risk for complications without adequate follow up. This format is felt to be most appropriate for this patient at this time. The patient did not have access to video technology or had technical difficulties with video requiring transitioning to audio format only (telephone). Physical exam was limited to content and character of the telephone converstion.    Patient location: Home Provider location: [facility]  I discussed the limitations of evaluation and management by telemedicine and the availability of in person appointments. The patient expressed understanding and agreed to proceed.   Visit Date: 02/16/2020  Today's healthcare provider: Mar Daring, PA-C   Chief Complaint  Patient presents with  . URI   Subjective    URI  This is a new problem. The current episode started in the past 7 days. The problem has been unchanged. There has been no fever. Associated symptoms include congestion, coughing (non-productive), rhinorrhea, sinus pain and a sore throat (first thing in the morning). Pertinent negatives include no ear pain, headaches, nausea, neck pain, plugged ear sensation, sneezing, swollen glands, vomiting or wheezing. She has tried acetaminophen, decongestant and sleep for the symptoms. The treatment provided mild relief.   HPI    URI    Associated symptoms inlclude congestion, cough and sinus pain.  Recent episode started in the past 7 days (Sunday night).  The problem has been unchanged since onset.  The temperature has been with in normal range.  Patient  is drinking plenty of fluids.  Past hisotry  is significant for  asthma.  Patient is not a smoker.       Last edited by Doristine Devoid, CMA on 02/16/2020  9:02 AM. (History)        Patient Active Problem List   Diagnosis Date Noted  . Allergic rhinitis 05/09/2017  . Acute stress disorder 12/15/2014  . Anxiety 12/15/2014  . Genital herpes 12/15/2014  . High risk sexual behavior 12/15/2014  . Frequent UTI 12/15/2014  . Asthma, exogenous 02/05/2003   Past Medical History:  Diagnosis Date  . Allergic rhinitis   . Asthma   . Genital herpes   . Vaccine for human papilloma virus (HPV) types 6, 11, 16, and 18 administered       Medications: Outpatient Medications Prior to Visit  Medication Sig  . albuterol (VENTOLIN HFA) 108 (90 Base) MCG/ACT inhaler Two inhalations up to four times a day for wheezing/asthma as needed.  . APRI 0.15-30 MG-MCG tablet TAKE 1 TABLET BY MOUTH EVERY DAY  . ascorbic acid (VITAMIN C) 500 MG tablet Take by mouth.  . fexofenadine (ALLEGRA) 180 MG tablet Take by mouth.  . fluticasone (FLONASE) 50 MCG/ACT nasal spray Place into the nose.  . montelukast (SINGULAIR) 10 MG tablet Take by mouth.  . valACYclovir (VALTREX) 500 MG tablet Take 1 tablet (500 mg total) by mouth daily.  Marland Kitchen azithromycin (ZITHROMAX) 250 MG tablet Take 2 tablets by mouth today then one daily for 4 days.  . Fluticasone Propionate, Inhal, (FLOVENT DISKUS) 250 MCG/BLIST AEPB Inhale 1 puff into the lungs 2 (two) times daily. (Patient not taking: Reported on 02/16/2020)  . promethazine (PHENERGAN) 12.5 MG tablet Take  1 tablet (12.5 mg total) by mouth every 8 (eight) hours as needed for nausea or vomiting.  . triamcinolone cream (KENALOG) 0.1 % Apply 1 application topically 2 (two) times daily. (Patient not taking: Reported on 02/16/2020)   No facility-administered medications prior to visit.    Review of Systems  Constitutional: Negative for chills, fatigue and fever.  HENT: Positive for congestion, postnasal drip, rhinorrhea, sinus  pressure, sinus pain and sore throat (first thing in the morning). Negative for ear pain and sneezing.   Respiratory: Positive for cough (non-productive). Negative for chest tightness and wheezing.   Gastrointestinal: Negative for nausea and vomiting.  Musculoskeletal: Negative for neck pain.  Neurological: Negative for headaches.    Last CBC No results found for: WBC, HGB, HCT, MCV, MCH, RDW, PLT Last metabolic panel No results found for: GLUCOSE, NA, K, CL, CO2, BUN, CREATININE, GFRNONAA, GFRAA, CALCIUM, PHOS, PROT, ALBUMIN, LABGLOB, AGRATIO, BILITOT, ALKPHOS, AST, ALT, ANIONGAP    Objective    There were no vitals taken for this visit. BP Readings from Last 3 Encounters:  03/16/19 90/70  04/14/18 109/70  02/27/18 108/60   Wt Readings from Last 3 Encounters:  03/16/19 145 lb (65.8 kg)  04/14/18 138 lb 6.4 oz (62.8 kg)  02/27/18 138 lb 9.6 oz (62.9 kg)        Assessment & Plan     1. Acute maxillary sinusitis, recurrence not specified Worsening symptoms that have not responded to OTC medications. Will give azithromycin as below. Continue allergy medications. Stay well hydrated and get plenty of rest. Patient does decline covid 19 testing at this time. Call if no symptom improvement or if symptoms worsen. - azithromycin (ZITHROMAX) 250 MG tablet; Take 2 tablets by mouth today then one daily for 4 days.  Dispense: 6 tablet; Refill: 0   No follow-ups on file.    I discussed the assessment and treatment plan with the patient. The patient was provided an opportunity to ask questions and all were answered. The patient agreed with the plan and demonstrated an understanding of the instructions.   The patient was advised to call back or seek an in-person evaluation if the symptoms worsen or if the condition fails to improve as anticipated.  I provided 8 minutes of non-face-to-face time during this encounter.  Reynolds Bowl, PA-C, have reviewed all documentation for this  visit. The documentation on 02/16/20 for the exam, diagnosis, procedures, and orders are all accurate and complete.  [place] [facility] [phone] (phone) [phone] (fax)  [place]

## 2020-03-15 NOTE — Progress Notes (Signed)
PCP:  Margo Common, PA-C   Chief Complaint  Patient presents with  . Gynecologic Exam    No concerns     HPI:      [name] is a 30 y.o. No obstetric history on file. who LMP was Patient's last menstrual period was 02/18/2020 (approximate)., presents today for her annual examination.  Her menses are regular every 28-30 days, lasting 3-4 days.  Dysmenorrhea mild. She does not have intermenstrual bleeding.  Sex activity: single partner, contraception - OCP (estrogen/progesterone). May want to conceive within yr. Last Pap: 02/19/18 Results were: no abnormalities Hx of STDs: HSV, takes valtrex prn, done through PCP  There is no FH of breast cancer. There is no FH of ovarian cancer. The patient does do self-breast exams.  Tobacco use: The patient denies current or previous tobacco use. Alcohol use: none No drug use.  Exercise: moderately active  She does get adequate calcium but not Vitamin D in her diet.  Past Medical History:  Diagnosis Date  . Allergic rhinitis   . Asthma   . Genital herpes   . Vaccine for human papilloma virus (HPV) types 6, 11, 16, and 18 administered     Past Surgical History:  Procedure Laterality Date  . WISDOM TOOTH EXTRACTION      Family History  Problem Relation Age of Onset  . Healthy Mother   . Hyperlipidemia Father   . Hypertension Father   . Healthy Sister   . Heart failure Maternal Grandmother   . Pancreatic cancer Maternal Grandfather   . Heart disease Paternal Grandfather     Social History   Socioeconomic History  . Marital status: Single    Spouse name: Not on file  . Number of children: Not on file  . Years of education: Not on file  . Highest education level: Not on file  Occupational History  . Not on file  Tobacco Use  . Smoking status: Never Smoker  . Smokeless tobacco: Never Used  Vaping Use  . Vaping Use: Never used  Substance and Sexual Activity  . Alcohol use: Yes    Alcohol/week: 0.0  standard drinks    Comment: OCCASIONALLY DRINKS BEER  . Drug use: No  . Sexual activity: Yes    Birth control/protection: Pill  Other Topics Concern  . Not on file  Social History Narrative  . Not on file   Social Determinants of Health   Financial Resource Strain: Not on file  Food Insecurity: Not on file  Transportation Needs: Not on file  Physical Activity: Not on file  Stress: Not on file  Social Connections: Not on file  Intimate Partner Violence: Not on file    Current Meds  Medication Sig  . albuterol (VENTOLIN HFA) 108 (90 Base) MCG/ACT inhaler Two inhalations up to four times a day for wheezing/asthma as needed.  Marland Kitchen ascorbic acid (VITAMIN C) 500 MG tablet Take by mouth.  . fexofenadine (ALLEGRA) 180 MG tablet Take by mouth.  . fluticasone (FLONASE) 50 MCG/ACT nasal spray Place into the nose.  Marland Kitchen Fluticasone Propionate, Inhal, (FLOVENT DISKUS) 250 MCG/BLIST AEPB Inhale 1 puff into the lungs 2 (two) times daily.  . montelukast (SINGULAIR) 10 MG tablet Take by mouth.  . triamcinolone cream (KENALOG) 0.1 % Apply 1 application topically 2 (two) times daily.  . valACYclovir (VALTREX) 500 MG tablet Take 1 tablet (500 mg total) by mouth daily.  . [DISCONTINUED] APRI 0.15-30 MG-MCG tablet TAKE 1 TABLET BY MOUTH  EVERY DAY     ROS:  Review of Systems  Constitutional: Negative for fatigue, fever and unexpected weight change.  Respiratory: Negative for cough, shortness of breath and wheezing.   Cardiovascular: Negative for chest pain, palpitations and leg swelling.  Gastrointestinal: Negative for blood in stool, constipation, diarrhea, nausea and vomiting.  Endocrine: Negative for cold intolerance, heat intolerance and polyuria.  Genitourinary: Negative for dyspareunia, dysuria, flank pain, frequency, genital sores, hematuria, menstrual problem, pelvic pain, urgency, vaginal bleeding, vaginal discharge and vaginal pain.  Musculoskeletal: Negative for back pain, joint swelling  and myalgias.  Skin: Positive for rash.  Neurological: Negative for dizziness, syncope, light-headedness, numbness and headaches.  Hematological: Negative for adenopathy.  Psychiatric/Behavioral: Positive for agitation. Negative for confusion, sleep disturbance and suicidal ideas. The patient is not nervous/anxious.      Objective: BP 120/70   Ht 5\' 4"  (1.626 m)   Wt 147 lb (66.7 kg)   LMP 02/18/2020 (Approximate)   BMI 25.23 kg/m    Physical Exam Constitutional:      Appearance: She is well-developed.  Genitourinary:     Vulva normal.     Right Labia: No rash, tenderness or lesions.    Left Labia: No tenderness, lesions or rash.    No vaginal discharge, erythema or tenderness.      Right Adnexa: not tender and no mass present.    Left Adnexa: not tender and no mass present.    No cervical friability or polyp.     Uterus is not enlarged or tender.  Breasts:     Right: No mass, nipple discharge, skin change or tenderness.     Left: No mass, nipple discharge, skin change or tenderness.    Neck:     Thyroid: No thyromegaly.  Cardiovascular:     Rate and Rhythm: Normal rate and regular rhythm.     Heart sounds: Normal heart sounds. No murmur heard.   Pulmonary:     Effort: Pulmonary effort is normal.     Breath sounds: Normal breath sounds.  Abdominal:     Palpations: Abdomen is soft.     Tenderness: There is no abdominal tenderness. There is no guarding or rebound.  Musculoskeletal:        General: Normal range of motion.     Cervical back: Normal range of motion.  Lymphadenopathy:     Cervical: No cervical adenopathy.  Neurological:     General: No focal deficit present.     Mental Status: She is alert and oriented to person, place, and time.     Cranial Nerves: No cranial nerve deficit.  Skin:    General: Skin is warm and dry.  Psychiatric:        Mood and Affect: Mood normal.        Behavior: Behavior normal.        Thought Content: Thought content  normal.        Judgment: Judgment normal.  Vitals reviewed.     Assessment/Plan: Encounter for annual routine gynecological examination  Encounter for surveillance of contraceptive pills - Plan: desogestrel-ethinyl estradiol (APRI) 0.15-30 MG-MCG tablet; OCP RF  Meds ordered this encounter  Medications  . desogestrel-ethinyl estradiol (APRI) 0.15-30 MG-MCG tablet    Sig: Take 1 tablet by mouth daily.    Dispense:  84 tablet    Refill:  3    Order Specific Question:   Supervising Provider    Answer:   Gae Dry [[id number]]  GYN counsel adequate intake of calcium and vitamin D, diet and exercise     F/U  Return in about 1 year (around 03/16/2021).  Liev Brockbank B. Nicha Hemann, PA-C 03/16/2020 8:37 AM

## 2020-03-16 ENCOUNTER — Ambulatory Visit: Payer: Managed Care, Other (non HMO)

## 2020-03-16 ENCOUNTER — Other Ambulatory Visit: Payer: Self-pay

## 2020-03-16 ENCOUNTER — Ambulatory Visit (INDEPENDENT_AMBULATORY_CARE_PROVIDER_SITE_OTHER): Payer: Managed Care, Other (non HMO) | Admitting: Obstetrics and Gynecology

## 2020-03-16 ENCOUNTER — Encounter: Payer: Self-pay | Admitting: Obstetrics and Gynecology

## 2020-03-16 VITALS — BP 120/70 | Ht 64.0 in | Wt 147.0 lb

## 2020-03-16 DIAGNOSIS — Z3041 Encounter for surveillance of contraceptive pills: Secondary | ICD-10-CM | POA: Diagnosis not present

## 2020-03-16 DIAGNOSIS — Z01419 Encounter for gynecological examination (general) (routine) without abnormal findings: Secondary | ICD-10-CM | POA: Diagnosis not present

## 2020-03-16 MED ORDER — DESOGESTREL-ETHINYL ESTRADIOL 0.15-30 MG-MCG PO TABS: 1.0000 | ORAL_TABLET | Freq: Every day | ORAL | 3 refills | Status: DC

## 2020-03-16 NOTE — Patient Instructions (Signed)
I value your feedback and you entrusting us with your care. If you get a [facility] patient survey, I would appreciate you taking the time to let us know about your experience today. Thank you! ? ? ?

## 2020-04-20 ENCOUNTER — Other Ambulatory Visit: Payer: Self-pay

## 2020-04-20 ENCOUNTER — Telehealth (INDEPENDENT_AMBULATORY_CARE_PROVIDER_SITE_OTHER): Payer: Managed Care, Other (non HMO) | Admitting: Adult Health

## 2020-04-20 ENCOUNTER — Encounter: Payer: Self-pay | Admitting: Adult Health

## 2020-04-20 DIAGNOSIS — J0100 Acute maxillary sinusitis, unspecified: Secondary | ICD-10-CM | POA: Insufficient documentation

## 2020-04-20 MED ORDER — AZITHROMYCIN 250 MG PO TABS: ORAL_TABLET | ORAL | 0 refills | Status: DC

## 2020-04-20 NOTE — Patient Instructions (Signed)
Azithromycin tablets What is this medicine? AZITHROMYCIN (az ith roe MYE sin) is a macrolide antibiotic. It is used to treat or prevent certain kinds of bacterial infections. It will not work for colds, flu, or other viral infections. This medicine may be used for other purposes; ask your health care provider or pharmacist if you have questions. COMMON BRAND NAME(S): Zithromax, Zithromax Tri-Pak, Zithromax Z-Pak What should I tell my health care provider before I take this medicine? They need to know if you have any of these conditions:  history of blood diseases, like leukemia  history of irregular heartbeat  kidney disease  liver disease  myasthenia gravis  an unusual or allergic reaction to azithromycin, erythromycin, other macrolide antibiotics, foods, dyes, or preservatives  pregnant or trying to get pregnant  breast-feeding How should I use this medicine? Take this medicine by mouth with a full glass of water. Follow the directions on the prescription label. The tablets can be taken with food or on an empty stomach. If the medicine upsets your stomach, take it with food. Take your medicine at regular intervals. Do not take your medicine more often than directed. Take all of your medicine as directed even if you think your are better. Do not skip doses or stop your medicine early. Talk to your pediatrician regarding the use of this medicine in children. While this drug may be prescribed for children as young as 6 months for selected conditions, precautions do apply. Overdosage: If you think you have taken too much of this medicine contact a poison control center or emergency room at once. NOTE: This medicine is only for you. Do not share this medicine with others. What if I miss a dose? If you miss a dose, take it as soon as you can. If it is almost time for your next dose, take only that dose. Do not take double or extra doses. What may interact with this medicine? Do not take  this medicine with any of the following medications:  cisapride  dronedarone  pimozide  thioridazine This medicine may also interact with the following medications:  antacids that contain aluminum or magnesium  birth control pills  colchicine  cyclosporine  digoxin  ergot alkaloids like dihydroergotamine, ergotamine  nelfinavir  other medicines that prolong the QT interval (an abnormal heart rhythm)  phenytoin  warfarin This list may not describe all possible interactions. Give your health care provider a list of all the medicines, herbs, non-prescription drugs, or dietary supplements you use. Also tell them if you smoke, drink alcohol, or use illegal drugs. Some items may interact with your medicine. What should I watch for while using this medicine? Tell your doctor or healthcare provider if your symptoms do not start to get better or if they get worse. This medicine may cause serious skin reactions. They can happen weeks to months after starting the medicine. Contact your healthcare provider right away if you notice fevers or flu-like symptoms with a rash. The rash may be red or purple and then turn into blisters or peeling of the skin. Or, you might notice a red rash with swelling of the face, lips or lymph nodes in your neck or under your arms. Do not treat diarrhea with over the counter products. Contact your doctor if you have diarrhea that lasts more than 2 days or if it is severe and watery. This medicine can make you more sensitive to the sun. Keep out of the sun. If you cannot avoid being in the   sun, wear protective clothing and use sunscreen. Do not use sun lamps or tanning beds/booths. What side effects may I notice from receiving this medicine? Side effects that you should report to your doctor or health care professional as soon as possible:  allergic reactions like skin rash, itching or hives, swelling of the face, lips, or tongue  bloody or watery  diarrhea  breathing problems  chest pain  fast, irregular heartbeat  muscle weakness  rash, fever, and swollen lymph nodes  redness, blistering, peeling, or loosening of the skin, including inside the mouth  signs and symptoms of liver injury like dark yellow or brown urine; general ill feeling or flu-like symptoms; light-colored stools; loss of appetite; nausea; right upper belly pain; unusually weak or tired; yellowing of the eyes or skin  white patches or sores in the mouth  unusually weak or tired Side effects that usually do not require medical attention (report to your doctor or health care professional if they continue or are bothersome):  diarrhea  nausea  stomach pain  vomiting This list may not describe all possible side effects. Call your doctor for medical advice about side effects. You may report side effects to FDA at 1-800-FDA-1088. Where should I keep my medicine? Keep out of the reach of children. Store at room temperature between 15 and 30 degrees C (59 and 86 degrees F). Throw away any unused medicine after the expiration date. NOTE: This sheet is a summary. It may not cover all possible information. If you have questions about this medicine, talk to your doctor, pharmacist, or health care provider.  2021 Elsevier/Gold Standard (2018-04-30 17:19:20) Sinusitis, Adult Sinusitis is soreness and swelling (inflammation) of your sinuses. Sinuses are hollow spaces in the bones around your face. They are located:  Around your eyes.  In the middle of your forehead.  Behind your nose.  In your cheekbones. Your sinuses and nasal passages are lined with a fluid called mucus. Mucus drains out of your sinuses. Swelling can trap mucus in your sinuses. This lets germs (bacteria, virus, or fungus) grow, which leads to infection. Most of the time, this condition is caused by a virus. What are the causes? This condition is caused  by:  Allergies.  Asthma.  Germs.  Things that block your nose or sinuses.  Growths in the nose (nasal polyps).  Chemicals or irritants in the air.  Fungus (rare). What increases the risk? You are more likely to develop this condition if:  You have a weak body defense system (immune system).  You do a lot of swimming or diving.  You use nasal sprays too much.  You smoke. What are the signs or symptoms? The main symptoms of this condition are pain and a feeling of pressure around the sinuses. Other symptoms include:  Stuffy nose (congestion).  Runny nose (drainage).  Swelling and warmth in the sinuses.  Headache.  Toothache.  A cough that may get worse at night.  Mucus that collects in the throat or the back of the nose (postnasal drip).  Being unable to smell and taste.  Being very tired (fatigue).  A fever.  Sore throat.  Bad breath. How is this diagnosed? This condition is diagnosed based on:  Your symptoms.  Your medical history.  A physical exam.  Tests to find out if your condition is short-term (acute) or long-term (chronic). Your doctor may: ? Check your nose for growths (polyps). ? Check your sinuses using a tool that has a light (endoscope). ?  Check for allergies or germs. ? Do imaging tests, such as an MRI or CT scan. How is this treated? Treatment for this condition depends on the cause and whether it is short-term or long-term.  If caused by a virus, your symptoms should go away on their own within 10 days. You may be given medicines to relieve symptoms. They include: ? Medicines that shrink swollen tissue in the nose. ? Medicines that treat allergies (antihistamines). ? A spray that treats swelling of the nostrils. ? Rinses that help get rid of thick mucus in your nose (nasal saline washes).  If caused by bacteria, your doctor may wait to see if you will get better without treatment. You may be given antibiotic medicine if you  have: ? A very bad infection. ? A weak body defense system.  If caused by growths in the nose, you may need to have surgery. Follow these instructions at home: Medicines  Take, use, or apply over-the-counter and prescription medicines only as told by your doctor. These may include nasal sprays.  If you were prescribed an antibiotic medicine, take it as told by your doctor. Do not stop taking the antibiotic even if you start to feel better. Hydrate and humidify  Drink enough water to keep your pee (urine) pale yellow.  Use a cool mist humidifier to keep the humidity level in your home above 50%.  Breathe in steam for 10-15 minutes, 3-4 times a day, or as told by your doctor. You can do this in the bathroom while a hot shower is running.  Try not to spend time in cool or dry air.   Rest  Rest as much as you can.  Sleep with your head raised (elevated).  Make sure you get enough sleep each night. General instructions  Put a warm, moist washcloth on your face 3-4 times a day, or as often as told by your doctor. This will help with discomfort.  Wash your hands often with soap and water. If there is no soap and water, use hand sanitizer.  Do not smoke. Avoid being around people who are smoking (secondhand smoke).  Keep all follow-up visits as told by your doctor. This is important.   Contact a doctor if:  You have a fever.  Your symptoms get worse.  Your symptoms do not get better within 10 days. Get help right away if:  You have a very bad headache.  You cannot stop throwing up (vomiting).  You have very bad pain or swelling around your face or eyes.  You have trouble seeing.  You feel confused.  Your neck is stiff.  You have trouble breathing. Summary  Sinusitis is swelling of your sinuses. Sinuses are hollow spaces in the bones around your face.  This condition is caused by tissues in your nose that become inflamed or swollen. This traps germs. These can  lead to infection.  If you were prescribed an antibiotic medicine, take it as told by your doctor. Do not stop taking it even if you start to feel better.  Keep all follow-up visits as told by your doctor. This is important. This information is not intended to replace advice given to you by your health care provider. Make sure you discuss any questions you have with your health care provider. Document Revised: 06/23/2017 Document Reviewed: 06/23/2017 Elsevier Patient Education  2021 [employer].

## 2020-04-20 NOTE — Progress Notes (Signed)
Virtual telephone visit    Virtual Visit via Telephone Note   This visit type was conducted due to national recommendations for restrictions regarding the COVID-19 Pandemic (e.g. social distancing) in an effort to limit this patient's exposure and mitigate transmission in our community. Due to her co-morbid illnesses, this patient is at least at moderate risk for complications without adequate follow up. This format is felt to be most appropriate for this patient at this time. The patient did not have access to video technology or had technical difficulties with video requiring transitioning to audio format only (telephone). Physical exam was limited to content and character of the telephone converstion.   Parties involved in visit as below:   Patient location: at home Provider location: Provider: Provider's office at  [facility], [place].     I discussed the limitations of evaluation and management by telemedicine and the availability of in person appointments. The patient expressed understanding and agreed to proceed.   Visit Date: 04/20/2020  Today's healthcare provider: Marcille Buffy, FNP   Chief Complaint  Patient presents with  . Sinus Problem   Subjective    Sinus Problem This is a new problem. The current episode started 1 to 4 weeks ago (04/10/20 onset. ). The problem has been gradually worsening since onset. There has been no fever. Associated symptoms include headaches, sinus pressure and sneezing. Pertinent negatives include no chills, congestion, coughing, diaphoresis, ear pain, hoarse voice, neck pain, shortness of breath, sore throat or swollen glands. Past treatments include spray decongestants (Dayquil and Mucinex). The treatment provided mild relief.    Denies any nose bleeds. Sinus pressure all over.  Has sinus congestion and drainage. She has history of sinusitis. She has Asthma.   Patient  denies any fever, body aches,chills,  rash, chest pain, shortness of breath, nausea, vomiting, or diarrhea.  She is taking allegra, Flonase. And Singulair daily.  Denies any loss of taste of smell.  Denies any exposure to covid.  No LMP recorded. 04/18/2020. Patient  denies any fever, body aches,chills, rash, chest pain, shortness of breath, nausea, vomiting, or diarrhea.   Denies dizziness, lightheadedness, pre syncopal or syncopal episodes.    Past Medical History:  Diagnosis Date  . Allergic rhinitis   . Asthma   . Genital herpes   . Vaccine for human papilloma virus (HPV) types 6, 11, 16, and 18 administered    Past Surgical History:  Procedure Laterality Date  . WISDOM TOOTH EXTRACTION     Social History   Tobacco Use  . Smoking status: Never Smoker  . Smokeless tobacco: Never Used  Vaping Use  . Vaping Use: Never used  Substance Use Topics  . Alcohol use: Yes    Alcohol/week: 0.0 standard drinks    Comment: OCCASIONALLY DRINKS BEER  . Drug use: No   Social History   Socioeconomic History  . Marital status: Single    Spouse name: Not on file  . Number of children: Not on file  . Years of education: Not on file  . Highest education level: Not on file  Occupational History  . Not on file  Tobacco Use  . Smoking status: Never Smoker  . Smokeless tobacco: Never Used  Vaping Use  . Vaping Use: Never used  Substance and Sexual Activity  . Alcohol use: Yes    Alcohol/week: 0.0 standard drinks    Comment: OCCASIONALLY DRINKS BEER  . Drug use: No  . Sexual activity: Yes  Birth control/protection: Pill  Other Topics Concern  . Not on file  Social History Narrative  . Not on file   Social Determinants of Health   Financial Resource Strain: Not on file  Food Insecurity: Not on file  Transportation Needs: Not on file  Physical Activity: Not on file  Stress: Not on file  Social Connections: Not on file  Intimate Partner Violence: Not on file   Family Status  Relation Name Status  . Mother   Alive  . Father  Alive  . Sister  Alive  . MGM  Deceased at age 22  . MGF  Deceased at age 81  . PGM  Deceased  . PGF  Deceased at age 7'S   Family History  Problem Relation Age of Onset  . Healthy Mother   . Hyperlipidemia Father   . Hypertension Father   . Healthy Sister   . Heart failure Maternal Grandmother   . Pancreatic cancer Maternal Grandfather   . Heart disease Paternal Grandfather    Allergies  Allergen Reactions  . Amoxicillin Rash  . Prednisone Rash    Rash on face, swelling, sick       Medications: Outpatient Medications Prior to Visit  Medication Sig  . albuterol (VENTOLIN HFA) 108 (90 Base) MCG/ACT inhaler Two inhalations up to four times a day for wheezing/asthma as needed.  Marland Kitchen ascorbic acid (VITAMIN C) 500 MG tablet Take by mouth.  . desogestrel-ethinyl estradiol (APRI) 0.15-30 MG-MCG tablet Take 1 tablet by mouth daily.  . fexofenadine (ALLEGRA) 180 MG tablet Take by mouth.  . fluticasone (FLONASE) 50 MCG/ACT nasal spray Place into the nose.  Marland Kitchen Fluticasone Propionate, Inhal, (FLOVENT DISKUS) 250 MCG/BLIST AEPB Inhale 1 puff into the lungs 2 (two) times daily.  . montelukast (SINGULAIR) 10 MG tablet Take by mouth.  . triamcinolone cream (KENALOG) 0.1 % Apply 1 application topically 2 (two) times daily.  . valACYclovir (VALTREX) 500 MG tablet Take 1 tablet (500 mg total) by mouth daily.   No facility-administered medications prior to visit.    Review of Systems  Constitutional: Negative for chills and diaphoresis.  HENT: Positive for sinus pressure and sneezing. Negative for congestion, ear pain, hoarse voice and sore throat.   Respiratory: Negative for cough and shortness of breath.   Musculoskeletal: Negative for neck pain.  Neurological: Positive for headaches.    Last CBC No results found for: WBC, HGB, HCT, MCV, MCH, RDW, PLT Last metabolic panel No results found for: GLUCOSE, NA, K, CL, CO2, BUN, CREATININE, GFRNONAA, GFRAA, CALCIUM, PHOS,  PROT, ALBUMIN, LABGLOB, AGRATIO, BILITOT, ALKPHOS, AST, ALT, ANIONGAP    Objective    There were no vitals taken for this visit. BP Readings from Last 3 Encounters:  03/16/20 120/70  03/16/19 90/70  04/14/18 109/70   Wt Readings from Last 3 Encounters:  03/16/20 147 lb (66.7 kg)  03/16/19 145 lb (65.8 kg)  04/14/18 138 lb 6.4 oz (62.8 kg)    no vitals available.  Patient is alert and oriented and responsive to questions Engages in conversation with provider. Speaks in full sentences without any pauses without any shortness of breath or distress.   Assessment & Plan     Acute maxillary sinusitis, recurrence not specified   Worsening symptoms despite allergy medications and over the counter treatment. Continue allergy medications, try nasal saline per package instructions.  Stay hydrated and rest. Patient declined covid 19 testing at this time.   Red Flags discussed. The patient was given clear  instructions to go to ER or return to medical center if any red flags develop, symptoms do not improve, worsen or new problems develop. They verbalized understanding.  Meds ordered this encounter  Medications  . azithromycin (ZITHROMAX) 250 MG tablet    Sig: By mouth Take 2 tablets day 1 (500mg  total) and 1 tablet ( 250 mg ) on days 2,3,4,5.    Dispense:  6 tablet    Refill:  0   Red Flags discussed. The patient was given clear instructions to go to ER or return to medical center if any red flags develop, symptoms do not improve, worsen or new problems develop. They verbalized understanding.   Return if symptoms worsen or fail to improve, for at any time for any worsening symptoms, Go to Emergency room/ urgent care if worse.    I discussed the assessment and treatment plan with the patient. The patient was provided an opportunity to ask questions and all were answered. The patient agreed with the plan and demonstrated an understanding of the instructions.   The patient was advised to  call back or seek an in-person evaluation if the symptoms worsen or if the condition fails to improve as anticipated.  I discussed the limitations of evaluation and management by telemedicine and the availability of in person appointments. The patient expressed understanding and agreed to proceed.  I provided 30 minutes of non-face-to-face time during this encounter. The entirety of the information documented in the History of Present Illness, Review of Systems and Physical Exam were personally obtained by me. Portions of this information were initially documented by the CMA and reviewed by me for thoroughness and accuracy.     Marcille Buffy, [place] [phone] (phone) [phone] (fax)  [place]

## 2020-09-12 ENCOUNTER — Ambulatory Visit (INDEPENDENT_AMBULATORY_CARE_PROVIDER_SITE_OTHER): Payer: BC Managed Care – PPO | Admitting: Family Medicine

## 2020-09-12 ENCOUNTER — Encounter: Payer: Self-pay | Admitting: Family Medicine

## 2020-09-12 ENCOUNTER — Other Ambulatory Visit: Payer: Self-pay

## 2020-09-12 DIAGNOSIS — H9202 Otalgia, left ear: Secondary | ICD-10-CM | POA: Diagnosis not present

## 2020-09-12 DIAGNOSIS — R0981 Nasal congestion: Secondary | ICD-10-CM

## 2020-09-12 MED ORDER — DOXYCYCLINE HYCLATE 100 MG PO TABS: 100.0000 mg | ORAL_TABLET | Freq: Two times a day (BID) | ORAL | 0 refills | Status: AC

## 2020-09-12 NOTE — Progress Notes (Signed)
Virtual telephone visit    Virtual Visit via Telephone Note   This visit type was conducted due to national recommendations for restrictions regarding the COVID-19 Pandemic (e.g. social distancing) in an effort to limit this patient's exposure and mitigate transmission in our community. Due to her co-morbid illnesses, this patient is at least at moderate risk for complications without adequate follow up. This format is felt to be most appropriate for this patient at this time. The patient did not have access to video technology or had technical difficulties with video requiring transitioning to audio format only (telephone). Physical exam was limited to content and character of the telephone converstion.     Patient location: home Provider location: [place] involved in the visit: patient, provider  I discussed the limitations of evaluation and management by telemedicine and the availability of in person appointments. The patient expressed understanding and agreed to proceed.   Visit Date: 09/12/2020  Today's healthcare provider: Lavon Paganini, MD   Chief Complaint  Patient presents with   Otalgia   Subjective    Otalgia  There is pain in the left ear. This is a new problem. The current episode started in the past 7 days. The problem has been gradually worsening. There has been no fever. Associated symptoms include coughing (productive of green sputum), rhinorrhea and a sore throat. Pertinent negatives include no abdominal pain, diarrhea, ear discharge, headaches, hearing loss, neck pain, rash or vomiting. Treatments tried: Dayquil. The treatment provided no relief. There is no history of a chronic ear infection.    L ear pain. Has been using drops that she had from several years ago. No fever. +Postnasal drainage.    Patient Active Problem List   Diagnosis Date Noted   Acute maxillary sinusitis 04/20/2020   Allergic rhinitis 05/09/2017   Acute  stress disorder 12/15/2014   Anxiety 12/15/2014   Genital herpes 12/15/2014   High risk sexual behavior 12/15/2014   Frequent UTI 12/15/2014   Asthma, exogenous 02/05/2003   Past Medical History:  Diagnosis Date   Allergic rhinitis    Asthma    Genital herpes    Vaccine for human papilloma virus (HPV) types 6, 11, 16, and 18 administered    Allergies  Allergen Reactions   Amoxicillin Rash   Prednisone Rash    Rash on face, swelling, sick       Medications: Outpatient Medications Prior to Visit  Medication Sig   albuterol (VENTOLIN HFA) 108 (90 Base) MCG/ACT inhaler Two inhalations up to four times a day for wheezing/asthma as needed.   ascorbic acid (VITAMIN C) 500 MG tablet Take by mouth.   desogestrel-ethinyl estradiol (APRI) 0.15-30 MG-MCG tablet Take 1 tablet by mouth daily.   fexofenadine (ALLEGRA) 180 MG tablet Take by mouth.   fluticasone (FLONASE) 50 MCG/ACT nasal spray Place into the nose.   Fluticasone Propionate, Inhal, (FLOVENT DISKUS) 250 MCG/BLIST AEPB Inhale 1 puff into the lungs 2 (two) times daily.   montelukast (SINGULAIR) 10 MG tablet Take by mouth.   [DISCONTINUED] azithromycin (ZITHROMAX) 250 MG tablet By mouth Take 2 tablets day 1 ('500mg'$  total) and 1 tablet ( 250 mg ) on days 2,3,4,5.   triamcinolone cream (KENALOG) 0.1 % Apply 1 application topically 2 (two) times daily. (Patient not taking: Reported on 09/12/2020)   valACYclovir (VALTREX) 500 MG tablet Take 1 tablet (500 mg total) by mouth daily. (Patient not taking: Reported on 09/12/2020)   No facility-administered medications prior to visit.  Review of Systems  HENT:  Positive for ear pain, rhinorrhea and sore throat. Negative for ear discharge and hearing loss.   Respiratory:  Positive for cough (productive of green sputum).   Gastrointestinal:  Negative for abdominal pain, diarrhea and vomiting.  Musculoskeletal:  Negative for neck pain.  Skin:  Negative for rash.  Neurological:  Negative for  headaches.     Objective    There were no vitals taken for this visit.     Assessment & Plan     1. Left ear pain 2. Sinus congestion - suspect viral etiology given duration, but cannot rule out bacterial otitis media without exam - recommend symptomatic management - will given doxycycline to cover for AOM - return precautions discussed - recommend home COVID testing   Return if symptoms worsen or fail to improve.    I discussed the assessment and treatment plan with the patient. The patient was provided an opportunity to ask questions and all were answered. The patient agreed with the plan and demonstrated an understanding of the instructions.   The patient was advised to call back or seek an in-person evaluation if the symptoms worsen or if the condition fails to improve as anticipated.  I provided 12 minutes of non-face-to-face time during this encounter.  I, Lavon Paganini, MD, have reviewed all documentation for this visit. The documentation on 09/12/20 for the exam, diagnosis, procedures, and orders are all accurate and complete.   Brady Schiller, Dionne Bucy, MD, MPH [facility]

## 2020-09-27 ENCOUNTER — Ambulatory Visit: Payer: Self-pay

## 2020-09-27 NOTE — Telephone Encounter (Signed)
Pt returned call. Concerned as her husband tested positive this AM. States she has a headache this am, no other symptoms. Advised to home test, if negative test again in 2-3 days or if symptoms worsen. Guidelines reviewed for self isolation. Pt verbalizes understanding. States will CB with covid test results. Reason for Disposition . Health Information question, no triage required and triager able to answer question  Protocols used: Information Only Call - No Triage-A-AH

## 2020-09-27 NOTE — Telephone Encounter (Signed)
Patients wife called in states patient tested positive for covid yesterday and she wants to knw if anything she should take to keep from catching ir from him. Please call back

## 2020-10-24 ENCOUNTER — Telehealth: Payer: Self-pay

## 2020-10-24 NOTE — Telephone Encounter (Signed)
Pt calling; recently talked c ABC about going off bc; does she need an appt or just a call back.  [phone] Adv pt to finish current pack and don't start another pack.

## 2020-10-30 ENCOUNTER — Other Ambulatory Visit: Payer: Self-pay

## 2020-10-30 ENCOUNTER — Ambulatory Visit (INDEPENDENT_AMBULATORY_CARE_PROVIDER_SITE_OTHER): Payer: BC Managed Care – PPO | Admitting: Dermatology

## 2020-10-30 DIAGNOSIS — Z1283 Encounter for screening for malignant neoplasm of skin: Secondary | ICD-10-CM | POA: Diagnosis not present

## 2020-10-30 DIAGNOSIS — D2271 Melanocytic nevi of right lower limb, including hip: Secondary | ICD-10-CM

## 2020-10-30 DIAGNOSIS — D492 Neoplasm of unspecified behavior of bone, soft tissue, and skin: Secondary | ICD-10-CM | POA: Diagnosis not present

## 2020-10-30 DIAGNOSIS — L814 Other melanin hyperpigmentation: Secondary | ICD-10-CM

## 2020-10-30 DIAGNOSIS — D229 Melanocytic nevi, unspecified: Secondary | ICD-10-CM

## 2020-10-30 DIAGNOSIS — D225 Melanocytic nevi of trunk: Secondary | ICD-10-CM | POA: Diagnosis not present

## 2020-10-30 NOTE — Patient Instructions (Addendum)
If you have any questions or concerns for your doctor, please call our main line at [phone] and press option 4 to reach your doctor's medical assistant. If no one answers, please leave a voicemail as directed and we will return your call as soon as possible. Messages left after 4 pm will be answered the following business day.   You may also send us a message via MyChart. We typically respond to MyChart messages within 1-2 business days.  For prescription refills, please ask your pharmacy to contact our office. Our fax number is [phone].  If you have an urgent issue when the clinic is closed that cannot wait until the next business day, you can page your doctor at the number below.    Please note that while we do our best to be available for urgent issues outside of office hours, we are not available 24/7.   If you have an urgent issue and are unable to reach us, you may choose to seek medical care at your doctor's office, retail clinic, urgent care center, or emergency room.  If you have a medical emergency, please immediately call 911 or go to the emergency department.  Pager Numbers  - Dr. Kowalski: [phone]  - Dr. Moye: [phone]  - Dr. Stewart: [phone]  In the event of inclement weather, please call our main line at [phone] for an update on the status of any delays or closures.  Dermatology Medication Tips: Please keep the boxes that topical medications come in in order to help keep track of the instructions about where and how to use these. Pharmacies typically print the medication instructions only on the boxes and not directly on the medication tubes.   If your medication is too expensive, please contact our office at [phone] option 4 or send us a message through MyChart.   We are unable to tell what your co-pay for medications will be in advance as this is different depending on your insurance coverage. However, we may be able to find a substitute  medication at lower cost or fill out paperwork to get insurance to cover a needed medication.   If a prior authorization is required to get your medication covered by your insurance company, please allow us 1-2 business days to complete this process.  Drug prices often vary depending on where the prescription is filled and some pharmacies may offer cheaper prices.  The website www.goodrx.com contains coupons for medications through different pharmacies. The prices here do not account for what the cost may be with help from insurance (it may be cheaper with your insurance), but the website can give you the price if you did not use any insurance.  - You can print the associated coupon and take it with your prescription to the pharmacy.  - You may also stop by our office during regular business hours and pick up a GoodRx coupon card.  - If you need your prescription sent electronically to a different pharmacy, notify our office through [facility] MyChart or by phone at [phone] option 4.   Wound Care Instructions  Cleanse wound gently with soap and water once a day then pat dry with clean gauze. Apply a thing coat of Petrolatum (petroleum jelly, "Vaseline") over the wound (unless you have an allergy to this). We recommend that you use a new, sterile tube of Vaseline. Do not pick or remove scabs. Do not remove the yellow or white "healing tissue" from the base of the wound.  Cover the   wound with fresh, clean, nonstick gauze and secure with paper tape. You may use Band-Aids in place of gauze and tape if the would is small enough, but would recommend trimming much of the tape off as there is often too much. Sometimes Band-Aids can irritate the skin.  You should call the office for your biopsy report after 1 week if you have not already been contacted.  If you experience any problems, such as abnormal amounts of bleeding, swelling, significant bruising, significant pain, or evidence of infection,  please call the office immediately.  FOR ADULT SURGERY PATIENTS: If you need something for pain relief you may take 1 extra strength Tylenol (acetaminophen) AND 2 Ibuprofen (200mg  each) together every 4 hours as needed for pain. (do not take these if you are allergic to them or if you have a reason you should not take them.) Typically, you may only need pain medication for 1 to 3 days.    Melanoma ABCDEs  Melanoma is the most dangerous type of skin cancer, and is the leading cause of death from skin disease.  You are more likely to develop melanoma if you: Have light-colored skin, light-colored eyes, or red or blond hair Spend a lot of time in the sun Tan regularly, either outdoors or in a tanning bed Have had blistering sunburns, especially during childhood Have a close family member who has had a melanoma Have atypical moles or large birthmarks  Early detection of melanoma is key since treatment is typically straightforward and cure rates are extremely high if we catch it early.   The first sign of melanoma is often a change in a mole or a new dark spot.  The ABCDE system is a way of remembering the signs of melanoma.  A for asymmetry:  The two halves do not match. B for border:  The edges of the growth are irregular. C for color:  A mixture of colors are present instead of an even brown color. D for diameter:  Melanomas are usually (but not always) greater than 76mm - the size of a pencil eraser. E for evolution:  The spot keeps changing in size, shape, and color.  Please check your skin once per month between visits. You can use a small mirror in front and a large mirror behind you to keep an eye on the back side or your body.   If you see any new or changing lesions before your next follow-up, please call to schedule a visit.  Please continue daily skin protection including broad spectrum sunscreen SPF 30+ to sun-exposed areas, reapplying every 2 hours as needed when you're outdoors.    Staying in the shade or wearing long sleeves, sun glasses (UVA+UVB protection) and wide brim hats (4-inch brim around the entire circumference of the hat) are also recommended for sun protection.

## 2020-10-30 NOTE — Progress Notes (Signed)
   New Patient Visit  Subjective  Valerie Avila is a 30 y.o. female who presents for the following: Total body skin exam (Some moles on back get irritated and pt feels one is larger).   The following portions of the chart were reviewed this encounter and updated as appropriate:    Review of Systems:  No other skin or systemic complaints except as noted in HPI or Assessment and Plan.  Objective  Well appearing patient in no apparent distress; mood and affect are within normal limits.  A full examination was performed including scalp, head, eyes, ears, nose, lips, neck, chest, axillae, abdomen, back, buttocks, bilateral upper extremities, bilateral lower extremities, hands, feet, fingers, toes, fingernails, and toenails. All findings within normal limits unless otherwise noted below.  L post medial shoulder 6.70mm brown pap     R pretibia; R upper abdomen  R upper abdomen 2.49mm med dark brown macule  R pretibia 2.75mm med dark brown macule   Assessment & Plan   Melanocytic Nevi - Tan-brown and/or pink-flesh-colored symmetric macules and papules - Benign appearing on exam today - Observation - Call clinic for new or changing moles - Recommend daily use of broad spectrum spf 30+ sunscreen to sun-exposed areas.   Lentigines - Scattered tan macules - Due to sun exposure - Benign-appering, observe - Recommend daily broad spectrum sunscreen SPF 30+ to sun-exposed areas, reapply every 2 hours as needed. - Call for any changes   Skin cancer screening performed today.  Neoplasm of skin L post medial shoulder  Epidermal / dermal shaving  Lesion diameter (cm):  0.6 Informed consent: discussed and consent obtained   Patient was prepped and draped in usual sterile fashion: area prepped with alcohol. Anesthesia: the lesion was anesthetized in a standard fashion   Anesthetic:  1% lidocaine w/ epinephrine 1-100,000 buffered w/ 8.4% NaHCO3 Instrument used: flexible razor  blade   Hemostasis achieved with: pressure, aluminum chloride and electrodesiccation   Outcome: patient tolerated procedure well   Post-procedure details: wound care instructions given   Post-procedure details comment:  Ointment and small bandage applied  Irritated nevus vrs other  Related Procedures Anatomic Pathology Report  Nevus (2) R pretibia; R upper abdomen  Benign-appearing.  Observation.  Call clinic for new or changing moles.  Recommend daily use of broad spectrum spf 30+ sunscreen to sun-exposed areas.    Return in about 1 year (around 10/30/2021) for TBSE.   I, Othelia Pulling, RMA, am acting as scribe for Brendolyn Patty, MD .  Documentation: I have reviewed the above documentation for accuracy and completeness, and I agree with the above.  Brendolyn Patty MD

## 2020-11-08 ENCOUNTER — Telehealth: Payer: Self-pay

## 2020-11-08 LAB — ANATOMIC PATHOLOGY REPORT

## 2020-11-08 NOTE — Telephone Encounter (Signed)
-----   Message from Brendolyn Patty, MD sent at 11/08/2020 12:42 PM EDT ----- COMPOUND MELANOCYTIC NEVUS  Benign mole - please call patient

## 2020-11-08 NOTE — Telephone Encounter (Signed)
Left message advising patient biopsy was benign.

## 2020-12-20 NOTE — Progress Notes (Addendum)
Avila, Valerie Muff, PA-C (Inactive)   Chief Complaint  Patient presents with   Amenorrhea    Neg UPT around 10/21    HPI:      [name] is a 30 y.o. [id number] whose LMP was Patient's last menstrual period was 10/27/2020 (exact date)., presents today for oligomenorrhea since stopping OCPs 9/22 for conception. LMP 10/27/20 and no bleeding since.  Neg UPT 10/21, no UPT done this month. Menses were regular every 28-30 days, lasting 3-4 days on OCPs. Mild Dysmenorrhea, no BTB. Menses were monthly prior to OCPs age 34, lasting 2 wks. Pt has done well on OCPs since. Not taking PNVs. Last annual 2/22.    Patient Active Problem List   Diagnosis Date Noted   Acute maxillary sinusitis 04/20/2020   Allergic rhinitis 05/09/2017   Acute stress disorder 12/15/2014   Anxiety 12/15/2014   Genital herpes 12/15/2014   High risk sexual behavior 12/15/2014   Frequent UTI 12/15/2014   Asthma, exogenous 02/05/2003    Past Surgical History:  Procedure Laterality Date   WISDOM TOOTH EXTRACTION      Family History  Problem Relation Age of Onset   Healthy Mother    Hyperlipidemia Father    Hypertension Father    Healthy Sister    Heart failure Maternal Grandmother    Pancreatic cancer Maternal Grandfather    Heart disease Paternal Grandfather     Social History   Socioeconomic History   Marital status: Single    Spouse name: Not on file   Number of children: Not on file   Years of education: Not on file   Highest education level: Not on file  Occupational History   Not on file  Tobacco Use   Smoking status: Never   Smokeless tobacco: Never  Vaping Use   Vaping Use: Never used  Substance and Sexual Activity   Alcohol use: Yes    Alcohol/week: 0.0 standard drinks    Comment: OCCASIONALLY DRINKS BEER   Drug use: No   Sexual activity: Yes    Birth control/protection: None  Other Topics Concern   Not on file  Social History Narrative   Not on file   Social  Determinants of Health   Financial Resource Strain: Not on file  Food Insecurity: Not on file  Transportation Needs: Not on file  Physical Activity: Not on file  Stress: Not on file  Social Connections: Not on file  Intimate Partner Violence: Not on file    Outpatient Medications Prior to Visit  Medication Sig Dispense Refill   albuterol (VENTOLIN HFA) 108 (90 Base) MCG/ACT inhaler Two inhalations up to four times a day for wheezing/asthma as needed. 18 g 1   ascorbic acid (VITAMIN C) 500 MG tablet Take by mouth.     fexofenadine (ALLEGRA) 180 MG tablet Take by mouth.     fluticasone (FLONASE) 50 MCG/ACT nasal spray Place into the nose.     Fluticasone Propionate, Inhal, (FLOVENT DISKUS) 250 MCG/BLIST AEPB Inhale 1 puff into the lungs 2 (two) times daily. 60 each 12   montelukast (SINGULAIR) 10 MG tablet Take by mouth.     desogestrel-ethinyl estradiol (APRI) 0.15-30 MG-MCG tablet Take 1 tablet by mouth daily. (Patient not taking: Reported on 12/21/2020) 84 tablet 3   triamcinolone cream (KENALOG) 0.1 % Apply 1 application topically 2 (two) times daily. (Patient not taking: Reported on 09/12/2020) 30 g 0   valACYclovir (VALTREX) 500 MG tablet Take 1 tablet (500 mg total)  by mouth daily. (Patient not taking: Reported on 09/12/2020) 30 tablet 3   No facility-administered medications prior to visit.      ROS:  Review of Systems  Constitutional:  Negative for fever.  Gastrointestinal:  Negative for blood in stool, constipation, diarrhea, nausea and vomiting.  Genitourinary:  Positive for menstrual problem. Negative for dyspareunia, dysuria, flank pain, frequency, hematuria, urgency, vaginal bleeding, vaginal discharge and vaginal pain.  Musculoskeletal:  Negative for back pain.  Skin:  Negative for rash.  BREAST: No symptoms   OBJECTIVE:   Vitals:  BP 92/60   Ht 5\' 4"  (1.626 m)   Wt 148 lb (67.1 kg)   LMP 10/27/2020 (Exact Date)   BMI 25.40 kg/m   Physical  Exam Constitutional:      Appearance: Normal appearance.  Pulmonary:     Effort: Pulmonary effort is normal.  Musculoskeletal:        General: Normal range of motion.  Neurological:     Mental Status: She is alert and oriented to person, place, and time.  Psychiatric:        Judgment: Judgment normal.   Results for orders placed or performed in visit on 12/21/20 (from the past 24 hour(s))  POCT urine pregnancy     Status: Normal   Collection Time: 12/21/20  3:52 PM  Result Value Ref Range   Preg Test, Ur Negative Negative     Assessment/Plan: Secondary oligomenorrhea--since stopping OCPs, neg UPT 10/21 and today. Most likely due to OCP use and should resume 2-3 months after cessation. If no menses by 12/22, will do provera after neg UPT. Reassurance.   Pre-conception counseling--start PNVs.    Return if symptoms worsen or fail to improve.  Letti Towell B. Ezella Kell, PA-C 12/21/2020 3:52 PM

## 2020-12-21 ENCOUNTER — Other Ambulatory Visit: Payer: Self-pay

## 2020-12-21 ENCOUNTER — Ambulatory Visit (INDEPENDENT_AMBULATORY_CARE_PROVIDER_SITE_OTHER): Payer: BC Managed Care – PPO | Admitting: Obstetrics and Gynecology

## 2020-12-21 ENCOUNTER — Encounter: Payer: Self-pay | Admitting: Obstetrics and Gynecology

## 2020-12-21 VITALS — BP 92/60 | Ht 64.0 in | Wt 148.0 lb

## 2020-12-21 DIAGNOSIS — N914 Secondary oligomenorrhea: Secondary | ICD-10-CM | POA: Diagnosis not present

## 2020-12-21 DIAGNOSIS — Z3169 Encounter for other general counseling and advice on procreation: Secondary | ICD-10-CM

## 2020-12-21 LAB — POCT URINE PREGNANCY: Preg Test, Ur: NEGATIVE

## 2020-12-21 NOTE — Addendum Note (Signed)
Addended by: Ardeth Perfect B on: 94/17/4081 03:54 PM   Modules accepted: Orders, Level of Service

## 2021-04-04 ENCOUNTER — Other Ambulatory Visit: Payer: Self-pay

## 2021-04-04 ENCOUNTER — Encounter: Payer: Self-pay | Admitting: Physician Assistant

## 2021-04-04 ENCOUNTER — Ambulatory Visit (INDEPENDENT_AMBULATORY_CARE_PROVIDER_SITE_OTHER): Payer: BC Managed Care – PPO | Admitting: Physician Assistant

## 2021-04-04 ENCOUNTER — Other Ambulatory Visit (HOSPITAL_COMMUNITY)
Admission: RE | Admit: 2021-04-04 | Discharge: 2021-04-04 | Disposition: A | Discharge status: Home / Self Care | Payer: BC Managed Care – PPO | Source: Ambulatory Visit | Attending: Physician Assistant | Admitting: Physician Assistant

## 2021-04-04 VITALS — BP 111/77 | HR 84 | Temp 97.7°F | Resp 14 | Wt 148.0 lb

## 2021-04-04 DIAGNOSIS — J3489 Other specified disorders of nose and nasal sinuses: Secondary | ICD-10-CM | POA: Insufficient documentation

## 2021-04-04 DIAGNOSIS — R490 Dysphonia: Secondary | ICD-10-CM | POA: Insufficient documentation

## 2021-04-04 DIAGNOSIS — R059 Cough, unspecified: Secondary | ICD-10-CM | POA: Insufficient documentation

## 2021-04-04 DIAGNOSIS — R350 Frequency of micturition: Secondary | ICD-10-CM | POA: Insufficient documentation

## 2021-04-04 DIAGNOSIS — R300 Dysuria: Secondary | ICD-10-CM | POA: Insufficient documentation

## 2021-04-04 DIAGNOSIS — R0981 Nasal congestion: Secondary | ICD-10-CM | POA: Insufficient documentation

## 2021-04-04 DIAGNOSIS — R519 Headache, unspecified: Secondary | ICD-10-CM | POA: Diagnosis not present

## 2021-04-04 DIAGNOSIS — N925 Other specified irregular menstruation: Secondary | ICD-10-CM | POA: Insufficient documentation

## 2021-04-04 DIAGNOSIS — N926 Irregular menstruation, unspecified: Secondary | ICD-10-CM | POA: Diagnosis not present

## 2021-04-04 DIAGNOSIS — J329 Chronic sinusitis, unspecified: Secondary | ICD-10-CM | POA: Insufficient documentation

## 2021-04-04 LAB — POCT URINALYSIS DIPSTICK
Bilirubin, UA: NEGATIVE
Glucose, UA: NEGATIVE
Ketones, UA: NEGATIVE
Leukocytes, UA: NEGATIVE
Nitrite, UA: NEGATIVE
Protein, UA: NEGATIVE
Spec Grav, UA: 1.015 (ref 1.010–1.025)
Urobilinogen, UA: 0.2 E.U./dL
pH, UA: 7 (ref 5.0–8.0)

## 2021-04-04 LAB — POCT URINE PREGNANCY: Preg Test, Ur: NEGATIVE

## 2021-04-04 NOTE — Progress Notes (Addendum)
?  ? ?I,Roshena L Chambers,acting as a Education administrator for [employer], PA-C.,have documented all relevant documentation on the behalf of Mardene Speak, PA-C,as directed by  [employer], PA-C while in the presence of [employer], PA-C.  ? ? ?Established patient visit ? ? ?Patient: Valerie Avila   DOB: December 15, 1990   31 y.o. Female  MRN: [id number] ?Visit Date: 04/04/2021 ? ?Today's healthcare provider: Mardene Speak, PA-C  ? ?Chief Complaint  ?Patient presents with  ? Sinus Problem  ? Urinary Frequency  ? ?Subjective  ?  ? ?Sinus Problem ?This is a new problem. Episode onset: 3 days ago. There has been no fever. Associated symptoms include coughing (productive), headaches, a hoarse voice, sinus pressure and sneezing. Pertinent negatives include no chills, shortness of breath, sore throat or swollen glands. Treatments tried: Mucinex and NyQuil. The treatment provided mild relief. She states that she wants to get better. She reports that Zpack in the past helped relieve her sinus symptoms. Symptoms worsen at night. Patient has not had any COVID testing since onset of symptoms.  ? ?Urinary Frequency: ?Patient complains of urinary frequency and dysuria x 2 days. Her last menstrual cycle was on 03/15/2021 and only lasted 2 days. She is not currently using any form of birth control.  ? ? ? ? ?Medications: ?Outpatient Medications Prior to Visit  ?Medication Sig  ? albuterol (VENTOLIN HFA) 108 (90 Base) MCG/ACT inhaler Two inhalations up to four times a day for wheezing/asthma as needed.  ? ascorbic acid (VITAMIN C) 500 MG tablet Take by mouth.  ? fexofenadine (ALLEGRA) 180 MG tablet Take by mouth.  ? fluticasone (FLONASE) 50 MCG/ACT nasal spray Place into the nose.  ? Fluticasone Propionate, Inhal, (FLOVENT DISKUS) 250 MCG/BLIST AEPB Inhale 1 puff into the lungs 2 (two) times daily.  ? montelukast (SINGULAIR) 10 MG tablet Take by mouth.  ? triamcinolone cream (KENALOG) 0.1 % Apply 1 application topically 2 (two) times  daily.  ? [DISCONTINUED] desogestrel-ethinyl estradiol (APRI) 0.15-30 MG-MCG tablet Take 1 tablet by mouth daily. (Patient not taking: Reported on 04/04/2021)  ? ?No facility-administered medications prior to visit.  ? ? ?Review of Systems  ?Constitutional:  Negative for appetite change, chills, fatigue and fever.  ?HENT:  Positive for sinus pressure and sneezing. Negative for sore throat.   ?Respiratory:  Positive for cough (prodcutive). Negative for chest tightness and shortness of breath.   ?Cardiovascular:  Negative for chest pain and palpitations.  ?Gastrointestinal:  Negative for abdominal pain, nausea and vomiting.  ?Genitourinary:  Positive for dysuria and frequency.  ?Neurological:  Positive for headaches. Negative for dizziness and weakness.  ? ?  ?  Objective  ?  ?BP 111/77 (BP Location: Left Arm, Patient Position: Sitting, Cuff Size: Normal)   Pulse 84   Temp 97.7 ?F (36.5 ?C) (Oral)   Resp 14   Wt 148 lb (67.1 kg)   LMP 03/15/2021   SpO2 99% Comment: room air  BMI 25.40 kg/m?  ?BP Readings from Last 3 Encounters:  ?04/04/21 111/77  ?12/21/20 92/60  ?03/16/20 120/70  ? ?Wt Readings from Last 3 Encounters:  ?04/04/21 148 lb (67.1 kg)  ?12/21/20 148 lb (67.1 kg)  ?03/16/20 147 lb (66.7 kg)  ? ? ?Physical Exam ?Vitals and nursing note reviewed.  ?Constitutional:   ?   General: She is in acute distress.  ?   Appearance: Normal appearance. She is normal weight.  ?HENT:  ?   Head: Normocephalic and atraumatic.  ?   Right  Ear: Tympanic membrane, ear canal and external ear normal.  ?   Left Ear: Tympanic membrane, ear canal and external ear normal.  ?   Ears:  ?   Comments: Fluid behind the TMs ?   Nose: Congestion and rhinorrhea present.  ?   Comments: Tenderness over ethmoid and frontal sinuses ?   Mouth/Throat:  ?   Mouth: Mucous membranes are moist.  ?   Pharynx: No posterior oropharyngeal erythema.  ?Eyes:  ?   Conjunctiva/sclera: Conjunctivae normal.  ?Cardiovascular:  ?   Rate and Rhythm: Normal rate  and regular rhythm.  ?   Pulses: Normal pulses.  ?   Heart sounds: Normal heart sounds.  ?Pulmonary:  ?   Effort: Pulmonary effort is normal.  ?   Breath sounds: Normal breath sounds.  ?Musculoskeletal:  ?   Cervical back: Normal range of motion and neck supple.  ?Neurological:  ?   Mental Status: She is alert.  ?  ? ? Assessment & Plan  ?  ? ?1. Urinary frequency ?- POCT Urinalysis Dipstick ?- POCT urine pregnancy ?- Urine cytology ancillary only ?- Urinalysis, microscopic only ? ?2. Abnormal short menstrual cycle ?She stopped her BC pills in September. ?- POCT urine pregnancy was negative today ? ?3. Dysuria ?- Urine cytology ancillary only ?- Urinalysis, microscopic only  ? ?4. Sinusitis ? symptoms and exam c/w sinusitis   ?- no evidence of AOM, CAP, strep pharyngitis, or other infection ?- given duration of symptoms, suspect viral etiology ?- will hold antibiotics. However, I will place an order for Zpack and send to pharmacy after receiving results of the labs. According to studies, prescribing antibiotics and instructing patients not to fill it unless their symptoms worsen or do not improve after several days can help to reduce antibiotic use  ?- discussed the potential negative side effects of antibiotics and that they can lead to resistance if used unnecessarily; discussed expectations for duration of symptoms. Discussed that they may feel bad for up to a week, but sometimes symptoms last longer. ?- explained  treatment options that will help with symptoms : symptomatic management (flonase, decongestants, etc) ?- discussed concerning symptoms like high fever, SOB, facial pain. The patient was advised to call back or seek an in-person evaluation if the symptoms worsen or if the condition fails to improve as anticipated. ?   ?I discussed the assessment and treatment plan with the patient  The patient was provided an opportunity to ask questions and all were answered. The patient agreed with the plan and  demonstrated an understanding of the instructions. ?  ?The entirety of the information documented in the History of Present Illness, Review of Systems and Physical Exam were personally obtained by me. Portions of this information were initially documented by the CMA and reviewed by me for thoroughness and accuracy.  ? ? ?Mardene Speak, PA-C  ?[facility] ?[phone] (phone) ?[phone] (fax) ? ?[facility] ?

## 2021-04-05 ENCOUNTER — Encounter: Payer: Self-pay | Admitting: Family Medicine

## 2021-04-05 LAB — URINALYSIS, MICROSCOPIC ONLY
Bacteria, UA: NONE SEEN
Casts: NONE SEEN /lpf
RBC, Urine: NONE SEEN /hpf (ref 0–2)

## 2021-04-05 NOTE — Telephone Encounter (Signed)
Please review

## 2021-04-06 ENCOUNTER — Telehealth: Payer: Self-pay

## 2021-04-06 LAB — URINE CYTOLOGY ANCILLARY ONLY
Bacterial Vaginitis-Urine: NEGATIVE
Candida Urine: NEGATIVE
Chlamydia: NEGATIVE
Comment: NEGATIVE
Comment: NEGATIVE
Comment: NORMAL
Neisseria Gonorrhea: NEGATIVE
Trichomonas: NEGATIVE

## 2021-04-06 NOTE — Telephone Encounter (Signed)
See result note, patient advised. KW ?

## 2021-04-06 NOTE — Telephone Encounter (Signed)
Copied from [facility] [phone]. Topic: General - Inquiry ?>> Apr 05, 2021  4:26 PM Greggory Keen D wrote: ?Reason for CRM: Pt called asking about her UA and UC cultures. ?[phone] ?

## 2021-04-07 ENCOUNTER — Other Ambulatory Visit: Payer: Self-pay | Admitting: Physician Assistant

## 2021-04-07 DIAGNOSIS — J0120 Acute ethmoidal sinusitis, unspecified: Secondary | ICD-10-CM

## 2021-04-07 MED ORDER — AZITHROMYCIN 250 MG PO TABS
ORAL_TABLET | ORAL | 0 refills | Status: AC
Start: 2021-04-07 — End: 2021-04-12

## 2021-04-07 NOTE — Progress Notes (Signed)
Ms Eke, ? ?Your prescription is sent to pharmacy. I hope you will get better. ?

## 2021-04-12 ENCOUNTER — Telehealth: Payer: BC Managed Care – PPO | Admitting: Family Medicine

## 2021-10-03 NOTE — Progress Notes (Unsigned)
PCP:  Mardene Speak, PA-C   No chief complaint on file.    HPI:      [name] is a 31 y.o. No obstetric history on file. who LMP was No LMP recorded., presents today for her annual examination.  Her menses are regular every 28-30 days, lasting 3-4 days.  Dysmenorrhea mild. She does not have intermenstrual bleeding.  Sex activity: single partner, contraception - OCP (estrogen/progesterone). May want to conceive within yr. Last Pap: 02/19/18 Results were: no abnormalities Hx of STDs: HSV, takes valtrex prn, done through PCP  There is no FH of breast cancer. There is no FH of ovarian cancer. The patient does do self-breast exams.  Tobacco use: The patient denies current or previous tobacco use. Alcohol use: none No drug use.  Exercise: moderately active  She does get adequate calcium but not Vitamin D in her diet.  Past Medical History:  Diagnosis Date   Allergic rhinitis    Asthma    Genital herpes    Vaccine for human papilloma virus (HPV) types 6, 11, 16, and 18 administered     Past Surgical History:  Procedure Laterality Date   WISDOM TOOTH EXTRACTION      Family History  Problem Relation Age of Onset   Healthy Mother    Hyperlipidemia Father    Hypertension Father    Healthy Sister    Heart failure Maternal Grandmother    Pancreatic cancer Maternal Grandfather    Heart disease Paternal Grandfather     Social History   Socioeconomic History   Marital status: Married    Spouse name: Not on file   Number of children: Not on file   Years of education: Not on file   Highest education level: Not on file  Occupational History   Not on file  Tobacco Use   Smoking status: Never   Smokeless tobacco: Never  Vaping Use   Vaping Use: Never used  Substance and Sexual Activity   Alcohol use: Yes    Alcohol/week: 0.0 standard drinks of alcohol    Comment: OCCASIONALLY DRINKS BEER   Drug use: No   Sexual activity: Yes    Birth  control/protection: None  Other Topics Concern   Not on file  Social History Narrative   Not on file   Social Determinants of Health   Financial Resource Strain: Not on file  Food Insecurity: Not on file  Transportation Needs: Not on file  Physical Activity: Not on file  Stress: Not on file  Social Connections: Not on file  Intimate Partner Violence: Not on file    No outpatient medications have been marked as taking for the 10/04/21 encounter (Appointment) with Tawonna Esquer, Elmo Putt B, PA-C.     ROS:  Review of Systems  Constitutional:  Negative for fatigue, fever and unexpected weight change.  Respiratory:  Negative for cough, shortness of breath and wheezing.   Cardiovascular:  Negative for chest pain, palpitations and leg swelling.  Gastrointestinal:  Negative for blood in stool, constipation, diarrhea, nausea and vomiting.  Endocrine: Negative for cold intolerance, heat intolerance and polyuria.  Genitourinary:  Negative for dyspareunia, dysuria, flank pain, frequency, genital sores, hematuria, menstrual problem, pelvic pain, urgency, vaginal bleeding, vaginal discharge and vaginal pain.  Musculoskeletal:  Negative for back pain, joint swelling and myalgias.  Skin:  Positive for rash.  Neurological:  Negative for dizziness, syncope, light-headedness, numbness and headaches.  Hematological:  Negative for adenopathy.  Psychiatric/Behavioral:  Positive for agitation. Negative  for confusion, sleep disturbance and suicidal ideas. The patient is not nervous/anxious.      Objective: There were no vitals taken for this visit.   Physical Exam Constitutional:      Appearance: She is well-developed.  Genitourinary:     Vulva normal.     Right Labia: No rash, tenderness or lesions.    Left Labia: No tenderness, lesions or rash.    No vaginal discharge, erythema or tenderness.      Right Adnexa: not tender and no mass present.    Left Adnexa: not tender and no mass present.     No cervical friability or polyp.     Uterus is not enlarged or tender.  Breasts:    Right: No mass, nipple discharge, skin change or tenderness.     Left: No mass, nipple discharge, skin change or tenderness.  Neck:     Thyroid: No thyromegaly.  Cardiovascular:     Rate and Rhythm: Normal rate and regular rhythm.     Heart sounds: Normal heart sounds. No murmur heard. Pulmonary:     Effort: Pulmonary effort is normal.     Breath sounds: Normal breath sounds.  Abdominal:     Palpations: Abdomen is soft.     Tenderness: There is no abdominal tenderness. There is no guarding or rebound.  Musculoskeletal:        General: Normal range of motion.     Cervical back: Normal range of motion.  Lymphadenopathy:     Cervical: No cervical adenopathy.  Neurological:     General: No focal deficit present.     Mental Status: She is alert and oriented to person, place, and time.     Cranial Nerves: No cranial nerve deficit.  Skin:    General: Skin is warm and dry.  Psychiatric:        Mood and Affect: Mood normal.        Behavior: Behavior normal.        Thought Content: Thought content normal.        Judgment: Judgment normal.  Vitals reviewed.     Assessment/Plan: Encounter for annual routine gynecological examination  Encounter for surveillance of contraceptive pills - Plan: desogestrel-ethinyl estradiol (APRI) 0.15-30 MG-MCG tablet; OCP RF  No orders of the defined types were placed in this encounter.            GYN counsel adequate intake of calcium and vitamin D, diet and exercise     F/U  No follow-ups on file.  Valerie Avila B. Valerie Edelstein, PA-C 10/03/2021 7:57 PM

## 2021-10-04 ENCOUNTER — Encounter: Payer: Self-pay | Admitting: Obstetrics and Gynecology

## 2021-10-04 ENCOUNTER — Ambulatory Visit (INDEPENDENT_AMBULATORY_CARE_PROVIDER_SITE_OTHER): Payer: BC Managed Care – PPO | Admitting: Obstetrics and Gynecology

## 2021-10-04 VITALS — BP 90/60 | Ht 64.0 in | Wt 149.0 lb

## 2021-10-04 DIAGNOSIS — Z3169 Encounter for other general counseling and advice on procreation: Secondary | ICD-10-CM

## 2021-10-04 DIAGNOSIS — Z124 Encounter for screening for malignant neoplasm of cervix: Secondary | ICD-10-CM

## 2021-10-04 DIAGNOSIS — Z3041 Encounter for surveillance of contraceptive pills: Secondary | ICD-10-CM

## 2021-10-04 DIAGNOSIS — Z01419 Encounter for gynecological examination (general) (routine) without abnormal findings: Secondary | ICD-10-CM

## 2021-10-04 NOTE — Patient Instructions (Signed)
I value your feedback and you entrusting us with your care. If you get a [facility] patient survey, I would appreciate you taking the time to let us know about your experience today. Thank you! ? ? ?

## 2021-10-10 LAB — IGP, RFX APTIMA HPV ASCU

## 2021-10-12 ENCOUNTER — Other Ambulatory Visit: Payer: BC Managed Care – PPO

## 2021-10-12 DIAGNOSIS — Z3169 Encounter for other general counseling and advice on procreation: Secondary | ICD-10-CM

## 2021-10-13 LAB — PROGESTERONE: Progesterone: 0.1 ng/mL

## 2021-10-26 ENCOUNTER — Telehealth: Payer: Self-pay

## 2021-10-26 NOTE — Telephone Encounter (Signed)
Pt called office to let you know she started bleeding today. She also wants to know if you can prescribe her something to help her ovulate?

## 2021-10-27 NOTE — Telephone Encounter (Signed)
If you just started your period today, then the progesterone was not done at the correct time. Was this period a week late? Per your 09/22/21 period, this period should have started about 9/15. Valerie Avila

## 2021-10-29 DIAGNOSIS — Z3169 Encounter for other general counseling and advice on procreation: Secondary | ICD-10-CM

## 2021-10-29 NOTE — Telephone Encounter (Signed)
Mychart msg created from [facility].

## 2021-11-06 ENCOUNTER — Ambulatory Visit: Payer: Managed Care, Other (non HMO) | Admitting: Dermatology

## 2021-11-19 ENCOUNTER — Other Ambulatory Visit: Payer: BC Managed Care – PPO

## 2021-11-19 DIAGNOSIS — N839 Noninflammatory disorder of ovary, fallopian tube and broad ligament, unspecified: Secondary | ICD-10-CM

## 2021-11-19 DIAGNOSIS — Z3169 Encounter for other general counseling and advice on procreation: Secondary | ICD-10-CM

## 2021-11-20 LAB — PROGESTERONE: Progesterone: 0.9 ng/mL

## 2021-11-22 ENCOUNTER — Telehealth: Payer: Self-pay | Admitting: Obstetrics and Gynecology

## 2021-11-22 DIAGNOSIS — Z3169 Encounter for other general counseling and advice on procreation: Secondary | ICD-10-CM

## 2021-11-22 NOTE — Telephone Encounter (Signed)
See telephone call note from 11/22/21

## 2021-11-22 NOTE — Telephone Encounter (Signed)
Pt had progesterone for ovulation done on 11/19/21 (Couldn't get in 11/16/21), menses started early on 11/20/21. Question if ovulated and labs too late vs no ovulation. Will try to catch one more time. Pt to do serum prog 12/06/21. If no ovulation, will check again 1 wk later since menses can be Q25-33 days. If still no ovulation, will check PCOS labs and Gyn u/s. Pt has noticed facial, arm and back acne since stopping OCPs a yr ago. No hx of PCOS in past.   Pt has tried for conception for 11 months.  Will check semen analysis in meantime with husband, Linea Calles, 08/07/92.

## 2021-11-26 ENCOUNTER — Other Ambulatory Visit: Payer: BC Managed Care – PPO

## 2021-12-06 DIAGNOSIS — Z3169 Encounter for other general counseling and advice on procreation: Secondary | ICD-10-CM | POA: Diagnosis not present

## 2021-12-07 LAB — PROGESTERONE: Progesterone: 0.2 ng/mL

## 2021-12-09 NOTE — Addendum Note (Signed)
Addended by: Ardeth Perfect B on: 47/0/9295 08:51 PM   Modules accepted: Orders

## 2021-12-18 ENCOUNTER — Encounter: Payer: Self-pay | Admitting: Obstetrics and Gynecology

## 2021-12-21 ENCOUNTER — Ambulatory Visit: Payer: BC Managed Care – PPO

## 2021-12-25 ENCOUNTER — Telehealth: Payer: Self-pay | Admitting: Obstetrics and Gynecology

## 2021-12-25 NOTE — Telephone Encounter (Signed)
Pt returned your call at 12:03.  I told her you would return her call after lunch.

## 2021-12-25 NOTE — Telephone Encounter (Signed)
Pls call pt to schedule [facility] appt for infertility.

## 2021-12-25 NOTE — Telephone Encounter (Signed)
Patient is returning missed call from [place]. Please advise?

## 2021-12-25 NOTE — Telephone Encounter (Signed)
Called pt, no answer, LVMTRC. 

## 2021-12-25 NOTE — Telephone Encounter (Signed)
[facility] is taking care of this in a mychart msg.

## 2021-12-26 DIAGNOSIS — N979 Female infertility, unspecified: Secondary | ICD-10-CM | POA: Diagnosis not present

## 2022-01-02 DIAGNOSIS — N979 Female infertility, unspecified: Secondary | ICD-10-CM | POA: Diagnosis not present

## 2022-01-03 NOTE — Telephone Encounter (Signed)
Patient is scheduled for 02/26/22 with [facility]

## 2022-01-08 DIAGNOSIS — N979 Female infertility, unspecified: Secondary | ICD-10-CM | POA: Diagnosis not present

## 2022-01-14 ENCOUNTER — Ambulatory Visit (INDEPENDENT_AMBULATORY_CARE_PROVIDER_SITE_OTHER): Payer: BC Managed Care – PPO | Admitting: Physician Assistant

## 2022-01-14 ENCOUNTER — Encounter: Payer: Self-pay | Admitting: Physician Assistant

## 2022-01-14 VITALS — BP 120/74 | HR 109 | Ht 64.0 in | Wt 149.6 lb

## 2022-01-14 DIAGNOSIS — J111 Influenza due to unidentified influenza virus with other respiratory manifestations: Secondary | ICD-10-CM | POA: Diagnosis not present

## 2022-01-14 DIAGNOSIS — R059 Cough, unspecified: Secondary | ICD-10-CM | POA: Diagnosis not present

## 2022-01-14 DIAGNOSIS — J029 Acute pharyngitis, unspecified: Secondary | ICD-10-CM | POA: Diagnosis not present

## 2022-01-14 DIAGNOSIS — J4521 Mild intermittent asthma with (acute) exacerbation: Secondary | ICD-10-CM | POA: Diagnosis not present

## 2022-01-14 LAB — POCT INFLUENZA A/B
Influenza A, POC: POSITIVE — AB
Influenza B, POC: NEGATIVE

## 2022-01-14 LAB — POC COVID19 BINAXNOW: SARS Coronavirus 2 Ag: NEGATIVE

## 2022-01-14 MED ORDER — MONTELUKAST SODIUM 10 MG PO TABS: 10.0000 mg | ORAL_TABLET | Freq: Every day | ORAL | 3 refills | Status: AC

## 2022-01-14 MED ORDER — ALBUTEROL SULFATE HFA 108 (90 BASE) MCG/ACT IN AERS: INHALATION_SPRAY | RESPIRATORY_TRACT | 1 refills | Status: AC

## 2022-01-14 MED ORDER — OSELTAMIVIR PHOSPHATE 75 MG PO CAPS: 75.0000 mg | ORAL_CAPSULE | Freq: Two times a day (BID) | ORAL | 0 refills | Status: DC

## 2022-01-14 NOTE — Progress Notes (Signed)
I,Sha'taria Tyson,acting as a Education administrator for [employer], PA-C.,have documented all relevant documentation on the behalf of Mardene Speak, PA-C,as directed by  [employer], PA-C while in the presence of [employer], PA-C.   Established patient visit   Patient: Valerie Avila   DOB: 1990/08/23   31 y.o. Female  MRN: [id number] Visit Date: 01/14/2022  Today's healthcare provider: Mardene Speak, PA-C   CC: cough  Subjective    Cough The current episode started in the past 7 days. The problem has been gradually worsening. The problem occurs constantly. The cough is Productive of sputum. Associated symptoms include headaches and a sore throat. The symptoms are aggravated by lying down. Risk factors for lung disease include smoking/tobacco exposure and animal exposure. Treatments tried: mucinex and dayquil. The treatment provided mild relief. Her past medical history is significant for asthma and environmental allergies.    No fever that patient is aware of. Just some night time sweats. Patient was recently in [place].  Medications: Outpatient Medications Prior to Visit  Medication Sig   ascorbic acid (VITAMIN C) 500 MG tablet Take by mouth.   fexofenadine (ALLEGRA) 180 MG tablet Take by mouth.   fluticasone (FLONASE) 50 MCG/ACT nasal spray Place into the nose.   [DISCONTINUED] albuterol (VENTOLIN HFA) 108 (90 Base) MCG/ACT inhaler Two inhalations up to four times a day for wheezing/asthma as needed.   [DISCONTINUED] montelukast (SINGULAIR) 10 MG tablet Take by mouth.   Fluticasone Propionate, Inhal, (FLOVENT DISKUS) 250 MCG/BLIST AEPB Inhale 1 puff into the lungs 2 (two) times daily. (Patient not taking: Reported on 01/14/2022)   No facility-administered medications prior to visit.    Review of Systems  HENT:  Positive for sore throat.   Respiratory:  Positive for cough.   Allergic/Immunologic: Positive for environmental allergies.  Neurological:  Positive for headaches.        Objective    BP 120/74 (BP Location: Left Arm, Patient Position: Sitting, Cuff Size: Normal)   Pulse (!) 109   Ht '5\' 4"'$  (1.626 m)   Wt 149 lb 9.6 oz (67.9 kg)   LMP 12/19/2021   SpO2 100%   BMI 25.68 kg/m    Physical Exam Vitals reviewed.  Constitutional:      General: She is in acute distress.     Appearance: Normal appearance. She is well-developed. She is not diaphoretic.  HENT:     Head: Normocephalic and atraumatic.     Right Ear: Ear canal and external ear normal. There is impacted cerumen (partially).     Left Ear: Ear canal and external ear normal. There is impacted cerumen (partially).     Nose: Congestion and rhinorrhea present.     Mouth/Throat:     Pharynx: Posterior oropharyngeal erythema (mild) present.     Comments: Postnasal drainage Eyes:     General: No scleral icterus.       Right eye: Discharge present.        Left eye: Discharge present.    Extraocular Movements: Extraocular movements intact.     Conjunctiva/sclera: Conjunctivae normal.     Pupils: Pupils are equal, round, and reactive to light.  Neck:     Thyroid: No thyromegaly.  Cardiovascular:     Rate and Rhythm: Normal rate and regular rhythm.     Pulses: Normal pulses.     Heart sounds: Normal heart sounds. No murmur heard. Pulmonary:     Effort: Pulmonary effort is normal. No respiratory distress.  Breath sounds: Normal breath sounds. No wheezing, rhonchi or rales.  Abdominal:     General: Abdomen is flat. Bowel sounds are normal.     Palpations: Abdomen is soft.  Musculoskeletal:        General: Normal range of motion.     Cervical back: Normal range of motion and neck supple.     Right lower leg: No edema.     Left lower leg: No edema.  Lymphadenopathy:     Cervical: Cervical adenopathy (posterior) present.  Skin:    General: Skin is warm and dry.     Findings: No rash.  Neurological:     General: No focal deficit present.     Mental Status: She is alert and oriented  to person, place, and time.  Psychiatric:        Behavior: Behavior normal.        Thought Content: Thought content normal.        Judgment: Judgment normal.     Results for orders placed or performed in visit on 01/14/22  POC COVID-19  Result Value Ref Range   SARS Coronavirus 2 Ag Negative Negative  POCT Influenza A/B  Result Value Ref Range   Influenza A, POC Positive (A) Negative   Influenza B, POC Negative Negative    Assessment & Plan     1. Cough, unspecified type 2. Sore throat 3. Influenza with upper respiratory symptoms X 2-3 days Acute problem. Recent travel to [place]. - POC COVID-19 negative - POCT Influenza A/B positive for influenza A - oseltamivir (TAMIFLU) 75 MG capsule; Take 1 capsule (75 mg total) by mouth 2 (two) times daily.  Dispense: 10 capsule; Refill: 0 Advised to initiate even if >48 hours have elapsed since illness onset.  If symptoms persist, contact us High risk patient with comorbidities: ? Asthma and allergic rhinitis  Mild intermittent extrinsic asthma Chronic No smoker Pt requested to call in the following medications: - montelukast (SINGULAIR) 10 MG tablet; Take 1 tablet (10 mg total) by mouth at bedtime. Take by mouth.Take by mouth.  Dispense: 90 tablet; Refill: 3 - albuterol (VENTOLIN HFA) 108 (90 Base) MCG/ACT inhaler; Two inhalations up to four times a day for wheezing/asthma as needed.  Dispense: 18 g; Refill: 1 Continue current medications Will reassess after completion of treatment for flu.  The patient was advised to call back or seek an in-person evaluation if the symptoms worsen or if the condition fails to improve as anticipated.  I discussed the assessment and treatment plan with the patient. The patient was provided an opportunity to ask questions and all were answered. The patient agreed with the plan and demonstrated an understanding of the instructions.  The entirety of the information documented in the History of Present  Illness, Review of Systems and Physical Exam were personally obtained by me. Portions of this information were initially documented by the CMA and reviewed by me for thoroughness and accuracy.   Mardene Speak, [facility], [place] [phone] (phone) [phone] (fax)

## 2022-01-23 DIAGNOSIS — N979 Female infertility, unspecified: Secondary | ICD-10-CM | POA: Diagnosis not present

## 2022-02-01 DIAGNOSIS — N979 Female infertility, unspecified: Secondary | ICD-10-CM | POA: Diagnosis not present

## 2022-02-26 ENCOUNTER — Encounter: Payer: BC Managed Care – PPO | Admitting: Obstetrics and Gynecology

## 2022-05-14 ENCOUNTER — Other Ambulatory Visit: Payer: Self-pay | Admitting: Obstetrics and Gynecology

## 2022-05-14 DIAGNOSIS — N979 Female infertility, unspecified: Secondary | ICD-10-CM

## 2022-05-16 ENCOUNTER — Ambulatory Visit
Admission: RE | Admit: 2022-05-16 | Discharge: 2022-05-16 | Disposition: A | Discharge status: Home / Self Care | Payer: Managed Care, Other (non HMO) | Source: Ambulatory Visit | Attending: Obstetrics and Gynecology | Admitting: Obstetrics and Gynecology

## 2022-05-16 DIAGNOSIS — N979 Female infertility, unspecified: Secondary | ICD-10-CM | POA: Insufficient documentation

## 2022-05-16 MED ORDER — IOHEXOL 300 MG/ML  SOLN
30.0000 mL | Freq: Once | INTRAMUSCULAR | Status: AC | PRN
  Administered 2022-05-16: 30 mL

## 2022-05-16 NOTE — Op Note (Signed)
Valerie Avila, Valerie Avila MEDICAL RECORD NO: [id number] ACCOUNT NO: [id number] DATE OF BIRTH: 1990-06-04 FACILITY: [facility] LOCATION: [facility] PHYSICIAN: Suzy Bouchard, MD  Operative Report   DATE OF PROCEDURE: 05/16/2022  PREOPERATIVE DIAGNOSIS: Primary infertility.  POSTOPERATIVE DIAGNOSIS:  Primary infertility.  SURGEON:  Suzy Bouchard, MD  ANESTHESIA:  None.  PROCEDURE: Hysterosalpingogram.  INDICATIONS:  32 year old gravida 0 patient with a negative workup for infertility thus far and is here for assessment of the uterus, fallopian tubes and conduit.  DESCRIPTION OF PROCEDURE:  The patient was counseled for the procedure.  She has signed a consent.  She has a negative pregnancy test 2 days ago and has abstained from sexual activity.  She is in a day #5 of her menstrual cycle.  The patient is on the  fluoroscopy table and a speculum was placed.  The cervix was visualized and Betadine prep to the cervix.  Single tooth tenaculum applied to the anterior cervix and the hysterosalpingogram catheter was placed into the uterine cavity.  The patient did have  some cramping with this part of the procedure.  Omnipaque 300 mL, 20 mL solution, filled in the syringe to be administered during fluoroscopy.  Radiologist then entered the room and the dye was injected with initial impression of the small uterine  cavity, but filling on both the right and left fallopian tube with the right preferentially and spillage bilaterally as well. Catheter was removed.  Single tooth tenaculum was removed.  The patient did have some moderate cramping during the procedure,  but was fine at the end of the procedure.   PUS D: 05/16/2022 11:30:12 am T: 05/16/2022 12:48:00 pm  JOB: [id number]/ [id number]

## 2023-02-14 DIAGNOSIS — N979 Female infertility, unspecified: Secondary | ICD-10-CM | POA: Diagnosis not present

## 2023-02-25 DIAGNOSIS — Z3202 Encounter for pregnancy test, result negative: Secondary | ICD-10-CM | POA: Diagnosis not present

## 2023-03-17 DIAGNOSIS — N979 Female infertility, unspecified: Secondary | ICD-10-CM | POA: Diagnosis not present

## 2023-03-24 DIAGNOSIS — N970 Female infertility associated with anovulation: Secondary | ICD-10-CM | POA: Diagnosis not present

## 2023-03-24 DIAGNOSIS — N979 Female infertility, unspecified: Secondary | ICD-10-CM | POA: Diagnosis not present

## 2023-04-14 DIAGNOSIS — N979 Female infertility, unspecified: Secondary | ICD-10-CM | POA: Diagnosis not present

## 2023-04-16 DIAGNOSIS — J4520 Mild intermittent asthma, uncomplicated: Secondary | ICD-10-CM | POA: Diagnosis not present

## 2023-04-16 DIAGNOSIS — J309 Allergic rhinitis, unspecified: Secondary | ICD-10-CM | POA: Diagnosis not present

## 2023-04-16 DIAGNOSIS — Z0000 Encounter for general adult medical examination without abnormal findings: Secondary | ICD-10-CM | POA: Diagnosis not present

## 2023-04-23 DIAGNOSIS — N979 Female infertility, unspecified: Secondary | ICD-10-CM | POA: Diagnosis not present

## 2023-04-25 DIAGNOSIS — Z0000 Encounter for general adult medical examination without abnormal findings: Secondary | ICD-10-CM | POA: Diagnosis not present

## 2023-04-25 DIAGNOSIS — Z13220 Encounter for screening for lipoid disorders: Secondary | ICD-10-CM | POA: Diagnosis not present

## 2023-05-16 DIAGNOSIS — R21 Rash and other nonspecific skin eruption: Secondary | ICD-10-CM | POA: Diagnosis not present

## 2023-07-15 DIAGNOSIS — O3680X0 Pregnancy with inconclusive fetal viability, not applicable or unspecified: Secondary | ICD-10-CM | POA: Diagnosis not present

## 2023-07-15 DIAGNOSIS — O0901 Supervision of pregnancy with history of infertility, first trimester: Secondary | ICD-10-CM | POA: Diagnosis not present

## 2023-07-15 DIAGNOSIS — N912 Amenorrhea, unspecified: Secondary | ICD-10-CM | POA: Diagnosis not present

## 2023-08-19 DIAGNOSIS — O0901 Supervision of pregnancy with history of infertility, first trimester: Secondary | ICD-10-CM | POA: Diagnosis not present

## 2023-08-19 DIAGNOSIS — O0992 Supervision of high risk pregnancy, unspecified, second trimester: Secondary | ICD-10-CM | POA: Diagnosis not present

## 2023-08-19 LAB — OB RESULTS CONSOLE HEPATITIS B SURFACE ANTIGEN: Hepatitis B Surface Ag: NEGATIVE

## 2023-08-19 LAB — OB RESULTS CONSOLE RUBELLA ANTIBODY, IGM: Rubella: NON-IMMUNE/NOT IMMUNE

## 2023-08-19 LAB — OB RESULTS CONSOLE VARICELLA ZOSTER ANTIBODY, IGG: Varicella: IMMUNE

## 2023-10-14 DIAGNOSIS — O0992 Supervision of high risk pregnancy, unspecified, second trimester: Secondary | ICD-10-CM | POA: Diagnosis not present

## 2023-10-27 DIAGNOSIS — Z3689 Encounter for other specified antenatal screening: Secondary | ICD-10-CM | POA: Diagnosis not present

## 2023-10-27 DIAGNOSIS — O280 Abnormal hematological finding on antenatal screening of mother: Secondary | ICD-10-CM | POA: Diagnosis not present

## 2023-10-27 DIAGNOSIS — R772 Abnormality of alphafetoprotein: Secondary | ICD-10-CM | POA: Diagnosis not present

## 2023-10-27 DIAGNOSIS — Z3A22 22 weeks gestation of pregnancy: Secondary | ICD-10-CM | POA: Diagnosis not present

## 2023-11-11 DIAGNOSIS — Z23 Encounter for immunization: Secondary | ICD-10-CM | POA: Diagnosis not present

## 2023-12-09 DIAGNOSIS — Z3A29 29 weeks gestation of pregnancy: Secondary | ICD-10-CM | POA: Diagnosis not present

## 2023-12-09 DIAGNOSIS — O280 Abnormal hematological finding on antenatal screening of mother: Secondary | ICD-10-CM | POA: Diagnosis not present

## 2023-12-09 DIAGNOSIS — O0992 Supervision of high risk pregnancy, unspecified, second trimester: Secondary | ICD-10-CM | POA: Diagnosis not present

## 2023-12-09 DIAGNOSIS — Z23 Encounter for immunization: Secondary | ICD-10-CM | POA: Diagnosis not present

## 2023-12-09 LAB — OB RESULTS CONSOLE RPR
RPR: NONREACTIVE
RPR: NONREACTIVE

## 2023-12-09 LAB — OB RESULTS CONSOLE HIV ANTIBODY (ROUTINE TESTING): HIV: NONREACTIVE

## 2024-01-12 DIAGNOSIS — Z2911 Encounter for prophylactic immunotherapy for respiratory syncytial virus (RSV): Secondary | ICD-10-CM | POA: Diagnosis not present

## 2024-01-12 DIAGNOSIS — Z3A33 33 weeks gestation of pregnancy: Secondary | ICD-10-CM | POA: Diagnosis not present

## 2024-01-12 DIAGNOSIS — Z23 Encounter for immunization: Secondary | ICD-10-CM | POA: Diagnosis not present

## 2024-01-27 DIAGNOSIS — O0993 Supervision of high risk pregnancy, unspecified, third trimester: Secondary | ICD-10-CM | POA: Diagnosis not present

## 2024-01-27 DIAGNOSIS — J4520 Mild intermittent asthma, uncomplicated: Secondary | ICD-10-CM | POA: Diagnosis not present

## 2024-01-27 DIAGNOSIS — Z3493 Encounter for supervision of normal pregnancy, unspecified, third trimester: Secondary | ICD-10-CM | POA: Diagnosis not present

## 2024-01-27 DIAGNOSIS — O99513 Diseases of the respiratory system complicating pregnancy, third trimester: Secondary | ICD-10-CM | POA: Diagnosis not present

## 2024-01-27 LAB — OB RESULTS CONSOLE GBS: GBS: NEGATIVE

## 2024-01-27 LAB — OB RESULTS CONSOLE GC/CHLAMYDIA
Chlamydia: NEGATIVE
Neisseria Gonorrhea: NEGATIVE

## 2024-02-03 NOTE — Progress Notes (Signed)
" °  [facility] - OBSTETRICS AND GYNECOLOGY   Routine Prenatal Care Visit  Subjective  Valerie Avila is a 33 y.o. G2P0010 at [redacted]w[redacted]d being seen today for ongoing prenatal care.  She is currently monitored for tthis low-risk pregnancy.  ----------------------------------------------------------------------------------- Patient reports a spot on her right nipple. This has enlarged.  The area started showing up 3-4 months ago.  It has gotten bigger and now a little painful.  It looks like a nipple on a nipple. The area is not bleeding. The pain is to touch or pressure.      .  .  Movement: Present. Leaking Fluid denies.  Vaginal Bleeding: denies ----------------------------------------------------------------------------------- The following portions of the patient's history were reviewed and updated as appropriate: allergies, current medications, past family history, past medical history, past social history, past surgical history and problem list. Problem list updated.  Objective  BP 106/72   Pulse 83   Ht 162.6 cm (5' 4)   Wt 75.8 kg (167 lb 3.2 oz)   LMP 05/20/2023 (Exact Date)   BMI 28.70 kg/m   Pregravid weight 65.8 kg (145 lb) Total Weight Gain 10.1 kg (22 lb 3.2 oz) Urinalysis: Urine Protein    Urine Glucose    Fetal Status: Fetal Heart Rate: 140 Fundal Height (cm): 38 cm Movement: Present     General:  Alert, oriented and cooperative. Patient is in no acute distress.  Skin: Skin is warm and dry. No rash noted.   Cardiovascular: Normal heart rate noted  Respiratory: Normal respiratory effort, no problems with respiration noted  Abdomen: Soft, gravid, appropriate for gestational age.       Pelvic:  Cervical exam deferred        Extremities: Normal range of motion.     Mental Status: Normal mood and affect. Normal behavior. Normal judgment and thought content.  Breast exam (female chaperone present, Bryce Jubilee, CMA): Right breast only.  Right nipple at about 7  o'clock location has flesh-colored protrusion that is about 4 mm x 2.5 mm in size. It is no erythematous, indurated, no scales or crusting. This is non-tender and is mobile.     Assessment   33 y.o. G2P0010 at [redacted]w[redacted]d by  02/24/2024, by Last Menstrual Period presenting for routine prenatal visit  Plan   Term labor symptoms and general obstetric precautions including but not limited to vaginal bleeding, contractions, leaking of fluid and fetal movement were reviewed in detail with the patient. Please refer to After Visit Summary for other counseling recommendations.   Breast lesion, seems consistent with skin tag or expected change with pregnancy. Continue to monitor postpartum. Discuss with lactation consultant in hospital as to any special considerations for breast feeding.    For vague history of herpes (never had an outbreak, but did have exposure in the genital area and a positive blood test) she was given prophylactic Valtrex , per protocol. Discussed rationale behind this.   Return in about 1 week (around 02/10/2024) for Routine prenatal.   Attestation Statement:   I personally performed the service. (TP)  STEPHEN TORIBIO MACE, MD  [facility] 02/03/2024 12:38 PM    "

## 2024-02-05 NOTE — L&D Delivery Note (Signed)
 Delivery Note  First Stage: Labor onset: 0430 Augmentation : none Analgesia /Anesthesia intrapartum: IVPM SROM at 0500  Second Stage: Complete dilation at 0812 Onset of pushing at 0812 FHR second stage recurrent early and variable decelerations  Delivery of a viable female infant 02/13/2024 at 0919 by Edsel Blush, CNM. delivery of fetal head in OA position with restitution to ROA. No nuchal cord;  Anterior then posterior shoulders delivered easily with gentle downward traction. Baby placed on mom's chest, and attended to by peds.  Cord double clamped after cessation of pulsation, cut by FOB  Third Stage: Placenta delivered Keren intact with 3 VC @ 0930 Placenta disposition: discarded Uterine tone firm / bleeding scant  Right labial laceration identified  Anesthesia for repair: lidocaine  Repair 3-0 vicryl Est. Blood Loss (mL):  Complications: none  Mom to postpartum.  Baby to Couplet care / Skin to Skin.  Newborn: Birth Weight: TBD, infant skin-to-skin  Apgar Scores: 9, 10 Feeding planned: formula and pumped breastmilk

## 2024-02-10 NOTE — Progress Notes (Signed)
 ROUTINE OB VISIT  Ms. Beneke 34 y.o. G2P0010 at [redacted]w[redacted]d  Factors complicating this pregnancy: - LGA - hx of infertility - asthma - RNI  S: No CTX, LOF, VB, DF. Good fetal movement. +BH this week O: Ht 162.6 cm (5' 4)   LMP 05/20/2023 (Exact Date)   BMI 28.70 kg/m   Abdomen: Gravid, nontender; Fundal height: 38 Ext : no edema, no rashes  SVE 1/70/-2/mid/soft  Fetal heart tones: 145 distinguished from mom  A/P (Problem list updated and notes commented) IUP at [redacted]w[redacted]d   Labor precautions and warning s/s reviewed [facility]'s daily RTC in 1 week  BETHANY JOHNATHAN PENTON, MD

## 2024-02-13 ENCOUNTER — Other Ambulatory Visit: Payer: Self-pay

## 2024-02-13 ENCOUNTER — Encounter: Payer: Self-pay | Admitting: Obstetrics and Gynecology

## 2024-02-13 ENCOUNTER — Inpatient Hospital Stay
Admission: EM | Admit: 2024-02-13 | Discharge: 2024-02-14 | DRG: 806 | Disposition: A | Payer: Self-pay | Attending: Certified Nurse Midwife | Admitting: Certified Nurse Midwife

## 2024-02-13 DIAGNOSIS — Z8742 Personal history of other diseases of the female genital tract: Secondary | ICD-10-CM

## 2024-02-13 DIAGNOSIS — F419 Anxiety disorder, unspecified: Secondary | ICD-10-CM | POA: Diagnosis present

## 2024-02-13 DIAGNOSIS — O4290 Premature rupture of membranes, unspecified as to length of time between rupture and onset of labor, unspecified weeks of gestation: Secondary | ICD-10-CM | POA: Diagnosis present

## 2024-02-13 DIAGNOSIS — O3663X0 Maternal care for excessive fetal growth, third trimester, not applicable or unspecified: Secondary | ICD-10-CM | POA: Diagnosis present

## 2024-02-13 DIAGNOSIS — A6000 Herpesviral infection of urogenital system, unspecified: Secondary | ICD-10-CM | POA: Diagnosis present

## 2024-02-13 DIAGNOSIS — O9832 Other infections with a predominantly sexual mode of transmission complicating childbirth: Secondary | ICD-10-CM | POA: Diagnosis present

## 2024-02-13 DIAGNOSIS — Z8249 Family history of ischemic heart disease and other diseases of the circulatory system: Secondary | ICD-10-CM

## 2024-02-13 DIAGNOSIS — J45909 Unspecified asthma, uncomplicated: Secondary | ICD-10-CM | POA: Diagnosis present

## 2024-02-13 DIAGNOSIS — O280 Abnormal hematological finding on antenatal screening of mother: Secondary | ICD-10-CM

## 2024-02-13 DIAGNOSIS — Z3A38 38 weeks gestation of pregnancy: Secondary | ICD-10-CM

## 2024-02-13 DIAGNOSIS — O26893 Other specified pregnancy related conditions, third trimester: Secondary | ICD-10-CM | POA: Diagnosis present

## 2024-02-13 DIAGNOSIS — Z2839 Other underimmunization status: Secondary | ICD-10-CM

## 2024-02-13 DIAGNOSIS — O9952 Diseases of the respiratory system complicating childbirth: Secondary | ICD-10-CM | POA: Diagnosis present

## 2024-02-13 LAB — CBC
HCT: 35.6 % — ABNORMAL LOW (ref 36.0–46.0)
Hemoglobin: 12 g/dL (ref 12.0–15.0)
MCH: 32 pg (ref 26.0–34.0)
MCHC: 33.7 g/dL (ref 30.0–36.0)
MCV: 94.9 fL (ref 80.0–100.0)
Platelets: 219 K/uL (ref 150–400)
RBC: 3.75 MIL/uL — ABNORMAL LOW (ref 3.87–5.11)
RDW: 13.3 % (ref 11.5–15.5)
WBC: 15 K/uL — ABNORMAL HIGH (ref 4.0–10.5)
nRBC: 0 % (ref 0.0–0.2)

## 2024-02-13 LAB — ABO/RH: ABO/RH(D): A POS

## 2024-02-13 LAB — SYPHILIS: RPR W/REFLEX TO RPR TITER AND TREPONEMAL ANTIBODIES, TRADITIONAL SCREENING AND DIAGNOSIS ALGORITHM: RPR Ser Ql: NONREACTIVE

## 2024-02-13 LAB — TYPE AND SCREEN
ABO/RH(D): A POS
Antibody Screen: NEGATIVE

## 2024-02-13 MED ORDER — WITCH HAZEL-GLYCERIN EX PADS
1.0000 | MEDICATED_PAD | CUTANEOUS | Status: DC | PRN
  Administered 2024-02-13: 1 via TOPICAL
  Filled 2024-02-13 (×3): qty 100

## 2024-02-13 MED ORDER — OXYCODONE-ACETAMINOPHEN 5-325 MG PO TABS: 1.0000 | ORAL_TABLET | ORAL | Status: DC | PRN

## 2024-02-13 MED ORDER — COCONUT OIL OIL
1.0000 | TOPICAL_OIL | Status: DC | PRN
  Filled 2024-02-13: qty 7.5

## 2024-02-13 MED ORDER — ONDANSETRON HCL 4 MG/2ML IJ SOLN: 4.0000 mg | INTRAMUSCULAR | Status: DC | PRN

## 2024-02-13 MED ORDER — TETANUS-DIPHTH-ACELL PERTUSSIS 5-2-15.5 LF-MCG/0.5 IM SUSP: 0.5000 mL | Freq: Once | INTRAMUSCULAR | Status: DC

## 2024-02-13 MED ORDER — ACETAMINOPHEN 325 MG PO TABS: 650.0000 mg | ORAL_TABLET | ORAL | Status: DC | PRN

## 2024-02-13 MED ORDER — LIDOCAINE HCL (PF) 1 % IJ SOLN
INTRAMUSCULAR | Status: AC
  Filled 2024-02-13: qty 30

## 2024-02-13 MED ORDER — IBUPROFEN 600 MG PO TABS
600.0000 mg | ORAL_TABLET | Freq: Four times a day (QID) | ORAL | Status: DC
  Administered 2024-02-13 – 2024-02-14 (×4): 600 mg via ORAL
  Filled 2024-02-13 (×4): qty 1

## 2024-02-13 MED ORDER — SIMETHICONE 80 MG PO CHEW: 80.0000 mg | CHEWABLE_TABLET | ORAL | Status: DC | PRN

## 2024-02-13 MED ORDER — OXYCODONE-ACETAMINOPHEN 5-325 MG PO TABS: 2.0000 | ORAL_TABLET | ORAL | Status: DC | PRN

## 2024-02-13 MED ORDER — OXYCODONE HCL 5 MG PO TABS
5.0000 mg | ORAL_TABLET | ORAL | Status: DC | PRN
  Administered 2024-02-13 – 2024-02-14 (×3): 5 mg via ORAL
  Filled 2024-02-13 (×5): qty 1

## 2024-02-13 MED ORDER — DIBUCAINE (PERIANAL) 1 % EX OINT: 1.0000 | TOPICAL_OINTMENT | CUTANEOUS | Status: DC | PRN

## 2024-02-13 MED ORDER — OXYTOCIN BOLUS FROM INFUSION
333.0000 mL | Freq: Once | INTRAVENOUS | Status: AC
  Administered 2024-02-13: 333 mL via INTRAVENOUS

## 2024-02-13 MED ORDER — ONDANSETRON HCL 4 MG PO TABS: 4.0000 mg | ORAL_TABLET | ORAL | Status: DC | PRN

## 2024-02-13 MED ORDER — BENZOCAINE-MENTHOL 20-0.5 % EX AERO
1.0000 | INHALATION_SPRAY | CUTANEOUS | Status: DC | PRN
  Administered 2024-02-13: 1 via TOPICAL
  Filled 2024-02-13 (×3): qty 56

## 2024-02-13 MED ORDER — AMMONIA AROMATIC IN INHA
RESPIRATORY_TRACT | Status: AC
  Filled 2024-02-13: qty 10

## 2024-02-13 MED ORDER — LIDOCAINE HCL (PF) 1 % IJ SOLN
30.0000 mL | INTRAMUSCULAR | Status: AC | PRN
  Administered 2024-02-13: 30 mL via SUBCUTANEOUS

## 2024-02-13 MED ORDER — LACTATED RINGERS IV SOLN: INTRAVENOUS | Status: DC

## 2024-02-13 MED ORDER — OXYTOCIN-SODIUM CHLORIDE 30-0.9 UT/500ML-% IV SOLN: 2.5000 [IU]/h | INTRAVENOUS | Status: DC

## 2024-02-13 MED ORDER — LACTATED RINGERS IV SOLN: 500.0000 mL | INTRAVENOUS | Status: DC | PRN

## 2024-02-13 MED ORDER — SOD CITRATE-CITRIC ACID 500-334 MG/5ML PO SOLN: 30.0000 mL | ORAL | Status: DC | PRN

## 2024-02-13 MED ORDER — MISOPROSTOL 200 MCG PO TABS
ORAL_TABLET | ORAL | Status: AC
  Filled 2024-02-13: qty 4

## 2024-02-13 MED ORDER — ONDANSETRON HCL 4 MG/2ML IJ SOLN: 4.0000 mg | Freq: Four times a day (QID) | INTRAMUSCULAR | Status: DC | PRN

## 2024-02-13 MED ORDER — SENNOSIDES-DOCUSATE SODIUM 8.6-50 MG PO TABS
2.0000 | ORAL_TABLET | Freq: Every day | ORAL | Status: DC
  Administered 2024-02-14: 2 via ORAL
  Filled 2024-02-13: qty 2

## 2024-02-13 MED ORDER — DIPHENHYDRAMINE HCL 25 MG PO CAPS: 25.0000 mg | ORAL_CAPSULE | Freq: Four times a day (QID) | ORAL | Status: DC | PRN

## 2024-02-13 MED ORDER — ZOLPIDEM TARTRATE 5 MG PO TABS: 5.0000 mg | ORAL_TABLET | Freq: Every evening | ORAL | Status: DC | PRN

## 2024-02-13 MED ORDER — ACETAMINOPHEN 325 MG PO TABS
650.0000 mg | ORAL_TABLET | ORAL | Status: DC | PRN
  Administered 2024-02-13 – 2024-02-14 (×5): 650 mg via ORAL
  Filled 2024-02-13 (×5): qty 2

## 2024-02-13 MED ORDER — MEASLES, MUMPS & RUBELLA VAC ~~LOC~~ SUSR: 0.5000 mL | SUBCUTANEOUS | Status: DC | PRN

## 2024-02-13 MED ORDER — OXYTOCIN-SODIUM CHLORIDE 30-0.9 UT/500ML-% IV SOLN
INTRAVENOUS | Status: AC
  Filled 2024-02-13: qty 500

## 2024-02-13 MED ORDER — FENTANYL CITRATE (PF) 100 MCG/2ML IJ SOLN
50.0000 ug | INTRAMUSCULAR | Status: DC | PRN
  Administered 2024-02-13: 100 ug via INTRAVENOUS
  Filled 2024-02-13: qty 2

## 2024-02-13 MED ORDER — PRENATAL MULTIVITAMIN CH
1.0000 | ORAL_TABLET | Freq: Every day | ORAL | Status: DC
  Administered 2024-02-13 – 2024-02-14 (×2): 1 via ORAL
  Filled 2024-02-13 (×2): qty 1

## 2024-02-13 MED ORDER — OXYTOCIN 10 UNIT/ML IJ SOLN
INTRAMUSCULAR | Status: AC
  Filled 2024-02-13: qty 2

## 2024-02-13 NOTE — Discharge Summary (Signed)
 Postpartum Discharge Summary  Patient Name: Valerie Avila DOB: 1990/06/20 MRN: [id number]  Date of admission: 02/13/2024 Delivery date:02/13/2024 Delivering provider: TANDA HOUSTON RENEE Date of discharge: 02/13/2024  Primary OB: [facility] LMP:No LMP recorded. EDC Estimated Date of Delivery: 02/24/24 Gestational Age at Delivery: [redacted]w[redacted]d   Admitting diagnosis: Amniotic fluid leaking [O42.90] Intrauterine pregnancy: [redacted]w[redacted]d     Secondary diagnosis:   Principal Problem:   NSVD (normal spontaneous vaginal delivery) Active Problems:   Anxiety   Asthma, exogenous   Amniotic fluid leaking   Rubella nonimmune status, delivered, current hospitalization   History of infertility   Abnormal antenatal AFP screen  Discharge Diagnosis: Term Pregnancy Delivered                                                Post partum procedures:{Postpartum procedures:23558} Augmentation:: N/A Complications: None Delivery Type: spontaneous vaginal delivery Anesthesia: IV narcotics Placenta: spontaneous To Pathology: No  Laceration: right labial, repaired with 3-0 Vicryl Episiotomy: none  Prenatal Labs:  Blood type/Rh A positive  Antibody screen neg  Rubella Non-Immune  Varicella Immune  RPR NR  HBsAg Neg  HIV NR  GC neg  Chlamydia neg  Genetic screening MaterniT21 negative, AFP positive  1 hour GTT 116  3 hour GTT    GBS Negative   Hospital course: Onset of Labor With Vaginal Delivery      34 y.o. yo G1P1001 at [redacted]w[redacted]d was admitted in Active Labor on 02/13/2024. Labor course was complicated by precipitous delivery. Apgars 9, 10.   Membrane Rupture Time/Date: 5:00 AM,02/13/2024  Delivery Method:Vaginal, Spontaneous Operative Delivery:N/A Episiotomy: None Lacerations:  Labial Patient had a postpartum course complicated by ***.  She is ambulating, tolerating a regular diet, passing flatus, and urinating well. Patient is discharged home in stable condition on 02/13/2024.  Newborn Data:  Valerie Avila Birth date:02/13/2024 Birth time:9:19 AM Gender:Female Living status:Living Apgars:9 ,10  Weight:   Magnesium Sulfate received: {Mag received:30440022} BMZ received: No Rhophylac:No MMR:{MMR:30440033} Varivax vaccine given: was not indicated T-DaP:Given prenatally Flu: No  Transfusion:{Transfusion received:30440034}  Physical exam  Vitals:   02/13/24 0530 02/13/24 0711 02/13/24 0933 02/13/24 0948  BP: 117/69 134/80 116/72 (!) 100/58  Pulse:  83 92 90  Resp: 20 18    Temp: (!) 97.4 F (36.3 C) (!) 97.4 F (36.3 C)    TempSrc: Oral Oral    Weight: 77.6 kg     Height: 5' 4 (1.626 m)      General: {Exam; general:21111117} Lochia: {Desc; appropriate/inappropriate:30686::appropriate} Uterine Fundus: {Desc; firm/soft:30687} Perineum:***minimal edema/{OB Perineal assessment:24215} Incision: {Exam; incision:21111123}, covered with occlusive OP site dressing *** DVT Evaluation: {Exam; icu:7888877}  Labs: Lab Results  Component Value Date   WBC 15.0 (H) 02/13/2024   HGB 12.0 02/13/2024   HCT 35.6 (L) 02/13/2024   MCV 94.9 02/13/2024   PLT 219 02/13/2024       No data to display         Edinburgh Score:     No data to display          Risk assessment for postpartum VTE and prophylactic treatment: Very high risk factors: None High risk factors: None Moderate risk factors: None  Postpartum VTE prophylaxis with LMWH not indicated  After visit meds:  Allergies as of 02/13/2024       Reactions   Amoxicillin Rash   Prednisone  Rash   Rash on face, swelling, sick      Med Rec must be completed prior to using this [facility]***      Discharge home in stable condition Infant Feeding: Bottle and Breast Infant Disposition:{CHL IP OB HOME WITH FNUYZM:76418} Discharge instruction: per After Visit Summary and Postpartum booklet. Activity: Advance as tolerated. Pelvic rest for 6 weeks.  Diet: routine diet Anticipated Birth Control: {Birth  Control:23956} Postpartum Appointment:6 weeks Additional Postpartum F/U: Postpartum Depression checkup Future Appointments:No future appointments. Follow up Visit:  Plan:  Valerie Avila was discharged to home in good condition. Follow-up appointment as directed.    Signed:  Edsel Charlies Blush, CNM 02/13/2024 10:02 AM

## 2024-02-13 NOTE — H&P (Signed)
 OB History & Physical - Late note d/t patient care  History of Present Illness:  Chief Complaint:   HPI:  Valerie Avila is a 34 y.o. G67P1001 female at [redacted]w[redacted]d dated by LMP c/w 8wk US .  She presents to L&D for uterine contractions that rapidly became stronger and closer together and her water broke upon arrival to the hospital.   Pregnancy Issues: 1. Asthma 2. LGA baby 3. Hx of infertility, achieved this pregnancy on her own 4. Rubella non-immune 5. Abnormal AFP, normal MaterniT21   Maternal Medical History:   Past Medical History:  Diagnosis Date   Allergic rhinitis    Asthma    Genital herpes    Vaccine for human papilloma virus (HPV) types 6, 11, 16, and 18 administered     Past Surgical History:  Procedure Laterality Date   WISDOM TOOTH EXTRACTION      Allergies[1]  Prior to Admission medications  Medication Sig Start Date End Date Taking? Authorizing Provider  fluticasone  (FLONASE ) 50 MCG/ACT nasal spray Place into the nose. 06/06/14  Yes [provider]  albuterol  (VENTOLIN  HFA) 108 (90 Base) MCG/ACT inhaler Two inhalations up to four times a day for wheezing/asthma as needed. Patient not taking: Reported on 02/13/2024 01/14/22   Ostwalt, Janna, PA-C  ascorbic acid (VITAMIN C) 500 MG tablet Take by mouth.    [provider]  fexofenadine (ALLEGRA) 180 MG tablet Take by mouth.    [provider]  Fluticasone  Propionate, Inhal, (FLOVENT  DISKUS) 250 MCG/BLIST AEPB Inhale 1 puff into the lungs 2 (two) times daily. Patient not taking: Reported on 01/14/2022 05/09/17   Gasper Nancyann FORBES, MD  montelukast  (SINGULAIR ) 10 MG tablet Take 1 tablet (10 mg total) by mouth at bedtime. Take by mouth.Take by mouth. 01/14/22   Ostwalt, Janna, PA-C  oseltamivir  (TAMIFLU ) 75 MG capsule Take 1 capsule (75 mg total) by mouth 2 (two) times daily. Patient not taking: Reported on 02/13/2024 01/14/22   Ostwalt, Janna, PA-C    Prenatal care site: [facility]    Social History: She  reports that she has never smoked. She has never used smokeless tobacco. She reports current alcohol use. She reports that she does not use drugs.  Family History: family history includes Healthy in her mother and sister; Heart disease in her paternal grandfather; Heart failure in her maternal grandmother; Hyperlipidemia in her father; Hypertension in her father; Pancreatic cancer in her maternal grandfather.   Review of Systems: A full review of systems was performed and negative except as noted in the HPI.     Physical Exam:  Vital Signs: BP (!) 100/58   Pulse 90   Temp (!) 97.4 F (36.3 C) (Oral)   Resp 18   Ht 5' 4 (1.626 m)   Wt 77.6 kg   Breastfeeding Unknown   BMI 29.35 kg/m  General: no acute distress.  HEENT: normocephalic, atraumatic Heart: regular rate & rhythm.  No murmurs/rubs/gallops Lungs: clear to auscultation bilaterally, normal respiratory effort Abdomen: soft, gravid, non-tender;  EFW: 8.5lb Pelvic:   External: Normal external female genitalia  Cervix: Dilation: 10 / Effacement (%): 80, 90 / Station: 0    Extremities: non-tender, symmetric, mild edema bilaterally.  DTRs: +2  Neurologic: Alert & oriented x 3.    Results for orders placed or performed during the hospital encounter of 02/13/24 (from the past 24 hours)  CBC     Status: Abnormal   Collection Time: 02/13/24  5:52 AM  Result Value Ref  Range   WBC 15.0 (H) 4.0 - 10.5 K/uL   RBC 3.75 (L) 3.87 - 5.11 MIL/uL   Hemoglobin 12.0 12.0 - 15.0 g/dL   HCT 64.3 (L) 63.9 - 53.9 %   MCV 94.9 80.0 - 100.0 fL   MCH 32.0 26.0 - 34.0 pg   MCHC 33.7 30.0 - 36.0 g/dL   RDW 86.6 88.4 - 84.4 %   Platelets 219 150 - 400 K/uL   nRBC 0.0 0.0 - 0.2 %  Type and screen [facility]     Status: None   Collection Time: 02/13/24  5:52 AM  Result Value Ref Range   ABO/RH(D) A POS    Antibody Screen NEG    Sample Expiration      02/16/2024,2359 Performed at [facility]  Lab, [address] [place], [place] [address]   ABO/Rh     Status: None   Collection Time: 02/13/24  6:41 AM  Result Value Ref Range   ABO/RH(D)      A POS Performed at [facility], [address], [place], [place] [address]     Pertinent Results:  Prenatal Labs: Blood type/Rh A positive  Antibody screen neg  Rubella Non-Immune  Varicella Immune  RPR NR  HBsAg Neg  HIV NR  GC neg  Chlamydia neg  Genetic screening MaterniT21 negative, AFP positive  1 hour GTT 116  3 hour GTT   GBS Negative   FHT: 120bpm, moderate variability, accelerations present, no decelerations, Category I tracing TOCO: contractions q2-11min, palpate moderate SVE:  Dilation: 10 / Effacement (%): 80, 90 / Station: 0    Cephalic by leopolds  No results found.  Assessment:  Valerie Avila is a 34 y.o. G23P1001 female at [redacted]w[redacted]d with active labor and SROM.   Plan:  1. Admit to Labor & Delivery; consents reviewed and obtained - Dr. Leonce notified of admission and plan of care  2. Fetal Well being  - Fetal Tracing: Category I tracing - Group B Streptococcus ppx indicated: n/a, GBS negative - Presentation: vertex confirmed by SVE   3. Routine OB: - Prenatal labs reviewed, as above - Rh positive - CBC, T&S, RPR on admit - Clear fluids, saline lock  4. Monitoring of Labor -  Contractions q2-42min, external toco in place -  Pelvis adequate for trial of labor -  No need for augmentation -  Plan for continuous fetal monitoring  -  Maternal pain control as desired; requesting IVPM, regional anesthesia, likely progressing too quickly for pain control - Anticipate vaginal delivery  5. Post Partum Planning: - Infant feeding: pumped breastmilk and formula - Contraception: undecided - Tdap: received AP - Flu: received AP - RSV: received AP  Edsel Charlies Blush, CNM 02/13/2024 9:52 AM        [1]  Allergies Allergen Reactions   Amoxicillin Rash   Prednisone Rash    Rash  on face, swelling, sick

## 2024-02-13 NOTE — Lactation Note (Signed)
 This note was copied from a baby's chart. Lactation Consultation Note  Patient Name: Valerie Avila Date: 02/13/2024 Age:32 hours Reason for consult: Initial assessment;Primapara;Exclusive pumping and bottle feeding;Early term 37-38.6wks;Mother's request;RN request   Maternal Data This is mom's 1st baby, SVD. Mom with history of infertility achieved pregnancy on her own.  On initial visit mom requested LC assistance with pump initiation. Does the patient have breastfeeding experience prior to this delivery?: No  Feeding Mother's Current Feeding Choice: Breast Milk and Formula Per mom she will formula feed and also pump to provide some breastmilk.   Lactation Tools Discussed/Used Tools: Pump Breast pump type: Double-Electric Breast Pump Pump Education: Setup, frequency, and cleaning;Milk Storage Reason for Pumping: Mom's request to pump and provide some breastmilk. Mom does not plan on direct breastfeeding. Pumping frequency: If mom would like to maximize milk production recommended 8 pump sessions in 24 hours in the first 2 weeks. Pumped volume:  (2 drops given to baby via pacifier)  Mom measured and sized for breast flanges right breast is 21 mm and left breast is 24 mm. Mom noted to have what appears to be a skin tag on her right nipple.Mom was unable to tolerate pump suction above 1. Recommended mom apply coconut oil to nipples before pumping. Mom reported cramps while pumping and requested pain medication. Care nurse in to bedside to provide pain medication. Interventions Interventions: DEBP;Coconut oil;Education  Discharge Pump: Personal;Hands Free (Mom has [employer] wearable pumps.)  Consult Status Consult Status: Follow-up Date: 02/14/24 Follow-up type: In-patient  Update provided to care nurse.  Avelina DELENA Gaskins 02/13/2024, 7:44 PM

## 2024-02-14 LAB — CBC
HCT: 36.7 % (ref 36.0–46.0)
Hemoglobin: 11.8 g/dL — ABNORMAL LOW (ref 12.0–15.0)
MCH: 31.5 pg (ref 26.0–34.0)
MCHC: 32.2 g/dL (ref 30.0–36.0)
MCV: 97.9 fL (ref 80.0–100.0)
Platelets: 202 K/uL (ref 150–400)
RBC: 3.75 MIL/uL — ABNORMAL LOW (ref 3.87–5.11)
RDW: 13.2 % (ref 11.5–15.5)
WBC: 13.9 K/uL — ABNORMAL HIGH (ref 4.0–10.5)
nRBC: 0 % (ref 0.0–0.2)

## 2024-02-14 MED ORDER — PRENATAL MULTIVITAMIN CH: 1.0000 | ORAL_TABLET | Freq: Every day | ORAL | Status: AC

## 2024-02-14 MED ORDER — IBUPROFEN 600 MG PO TABS: 600.0000 mg | ORAL_TABLET | Freq: Four times a day (QID) | ORAL | 0 refills | Status: AC | PRN

## 2024-02-14 MED ORDER — ACETAMINOPHEN 325 MG PO TABS: 650.0000 mg | ORAL_TABLET | Freq: Four times a day (QID) | ORAL | 0 refills | Status: AC | PRN

## 2024-02-14 MED ORDER — OXYCODONE HCL 5 MG PO TABS: 5.0000 mg | ORAL_TABLET | Freq: Four times a day (QID) | ORAL | 0 refills | Status: AC | PRN

## 2024-02-14 MED ORDER — BENZOCAINE-MENTHOL 20-0.5 % EX AERO: 1.0000 | INHALATION_SPRAY | CUTANEOUS | 0 refills | Status: AC | PRN

## 2024-02-14 MED ORDER — WITCH HAZEL-GLYCERIN EX PADS: 1.0000 | MEDICATED_PAD | CUTANEOUS | 0 refills | Status: AC | PRN

## 2024-02-14 MED ORDER — IBUPROFEN 600 MG PO TABS
600.0000 mg | ORAL_TABLET | Freq: Four times a day (QID) | ORAL | Status: DC
  Administered 2024-02-14: 600 mg via ORAL
  Filled 2024-02-14: qty 1

## 2024-02-14 MED ORDER — SIMETHICONE 80 MG PO CHEW: 80.0000 mg | CHEWABLE_TABLET | ORAL | 0 refills | Status: AC | PRN

## 2024-02-14 MED ORDER — SENNOSIDES-DOCUSATE SODIUM 8.6-50 MG PO TABS: 2.0000 | ORAL_TABLET | Freq: Every evening | ORAL | 0 refills | Status: AC | PRN

## 2024-02-18 ENCOUNTER — Encounter: Payer: Self-pay | Admitting: Obstetrics and Gynecology
